# Patient Record
Sex: Female | Born: 1955
Health system: Southern US, Community
[De-identification: ages and names within clinical notes are randomized; demographics above are authoritative.]

## PROBLEM LIST (undated history)

## (undated) DIAGNOSIS — K635 Polyp of colon: Secondary | ICD-10-CM

## (undated) DIAGNOSIS — G43909 Migraine, unspecified, not intractable, without status migrainosus: Secondary | ICD-10-CM

## (undated) DIAGNOSIS — N951 Menopausal and female climacteric states: Secondary | ICD-10-CM

## (undated) DIAGNOSIS — K3184 Gastroparesis: Secondary | ICD-10-CM

## (undated) DIAGNOSIS — M1990 Unspecified osteoarthritis, unspecified site: Secondary | ICD-10-CM

## (undated) DIAGNOSIS — K219 Gastro-esophageal reflux disease without esophagitis: Secondary | ICD-10-CM

## (undated) DIAGNOSIS — M329 Systemic lupus erythematosus, unspecified: Secondary | ICD-10-CM

## (undated) DIAGNOSIS — I7300 Raynaud's syndrome without gangrene: Secondary | ICD-10-CM

## (undated) DIAGNOSIS — IMO0002 Reserved for concepts with insufficient information to code with codable children: Secondary | ICD-10-CM

## (undated) DIAGNOSIS — E785 Hyperlipidemia, unspecified: Secondary | ICD-10-CM

## (undated) DIAGNOSIS — F419 Anxiety disorder, unspecified: Secondary | ICD-10-CM

## (undated) DIAGNOSIS — I491 Atrial premature depolarization: Secondary | ICD-10-CM

## (undated) HISTORY — PX: NASAL SINUS SURGERY: SHX719

## (undated) HISTORY — PX: WRIST SURGERY: SHX841

## (undated) HISTORY — DX: Unspecified osteoarthritis, unspecified site: M19.90

## (undated) HISTORY — PX: LAPAROSCOPY: SHX197

## (undated) HISTORY — DX: Reserved for concepts with insufficient information to code with codable children: IMO0002

## (undated) HISTORY — PX: HEMORRHOID SURGERY: SHX153

## (undated) HISTORY — DX: Raynaud's syndrome without gangrene: I73.00

## (undated) HISTORY — PX: TOTAL KNEE ARTHROPLASTY: SHX125

## (undated) HISTORY — DX: Gastroparesis: K31.84

## (undated) HISTORY — DX: Anxiety disorder, unspecified: F41.9

## (undated) HISTORY — DX: Migraine, unspecified, not intractable, without status migrainosus: G43.909

## (undated) HISTORY — DX: Menopausal and female climacteric states: N95.1

## (undated) HISTORY — DX: Gastro-esophageal reflux disease without esophagitis: K21.9

## (undated) HISTORY — DX: Systemic lupus erythematosus, unspecified: M32.9

## (undated) HISTORY — DX: Polyp of colon: K63.5

## (undated) HISTORY — PX: CHOLECYSTECTOMY: SHX55

## (undated) HISTORY — DX: Atrial premature depolarization: I49.1

## (undated) HISTORY — PX: HIP PINNING: SHX1757

## (undated) HISTORY — DX: Hyperlipidemia, unspecified: E78.5

---

## 2006-06-11 ENCOUNTER — Inpatient Hospital Stay (HOSPITAL_COMMUNITY): Admission: RE | Admit: 2006-06-11 | Discharge: 2006-06-16 | Payer: Self-pay | Admitting: Orthopaedic Surgery

## 2010-09-10 ENCOUNTER — Encounter: Payer: Self-pay | Admitting: Orthopaedic Surgery

## 2012-03-27 LAB — HM MAMMOGRAPHY

## 2013-03-13 ENCOUNTER — Other Ambulatory Visit: Payer: Self-pay | Admitting: *Deleted

## 2013-03-13 DIAGNOSIS — R5383 Other fatigue: Secondary | ICD-10-CM

## 2013-03-13 DIAGNOSIS — E785 Hyperlipidemia, unspecified: Secondary | ICD-10-CM

## 2013-03-13 DIAGNOSIS — R5381 Other malaise: Secondary | ICD-10-CM

## 2013-03-13 DIAGNOSIS — E559 Vitamin D deficiency, unspecified: Secondary | ICD-10-CM

## 2013-03-16 ENCOUNTER — Other Ambulatory Visit: Payer: Self-pay

## 2013-03-18 ENCOUNTER — Other Ambulatory Visit: Payer: Self-pay

## 2013-03-20 ENCOUNTER — Other Ambulatory Visit: Payer: Self-pay

## 2013-03-20 LAB — COMPLETE METABOLIC PANEL WITH GFR
ALT: 9 U/L (ref 0–35)
AST: 17 U/L (ref 0–37)
Albumin: 4.2 g/dL (ref 3.5–5.2)
Alkaline Phosphatase: 67 U/L (ref 39–117)
BUN: 11 mg/dL (ref 6–23)
CO2: 34 mEq/L — ABNORMAL HIGH (ref 19–32)
Calcium: 9.5 mg/dL (ref 8.4–10.5)
Chloride: 101 mEq/L (ref 96–112)
Creat: 0.89 mg/dL (ref 0.50–1.10)
GFR, Est African American: 84 mL/min
GFR, Est Non African American: 73 mL/min
Glucose, Bld: 95 mg/dL (ref 70–99)
Potassium: 4.4 mEq/L (ref 3.5–5.3)
Sodium: 139 mEq/L (ref 135–145)
Total Bilirubin: 0.6 mg/dL (ref 0.3–1.2)
Total Protein: 7.1 g/dL (ref 6.0–8.3)

## 2013-03-20 LAB — CBC WITH DIFFERENTIAL/PLATELET
Basophils Absolute: 0 10*3/uL (ref 0.0–0.1)
Basophils Relative: 0 % (ref 0–1)
Eosinophils Absolute: 0.2 10*3/uL (ref 0.0–0.7)
Eosinophils Relative: 5 % (ref 0–5)
HCT: 36.1 % (ref 36.0–46.0)
Hemoglobin: 12.1 g/dL (ref 12.0–15.0)
Lymphocytes Relative: 30 % (ref 12–46)
Lymphs Abs: 1.4 10*3/uL (ref 0.7–4.0)
MCH: 28.9 pg (ref 26.0–34.0)
MCHC: 33.5 g/dL (ref 30.0–36.0)
MCV: 86.4 fL (ref 78.0–100.0)
Monocytes Absolute: 0.5 10*3/uL (ref 0.1–1.0)
Monocytes Relative: 11 % (ref 3–12)
Neutro Abs: 2.5 10*3/uL (ref 1.7–7.7)
Neutrophils Relative %: 54 % (ref 43–77)
Platelets: 205 10*3/uL (ref 150–400)
RBC: 4.18 MIL/uL (ref 3.87–5.11)
RDW: 15.1 % (ref 11.5–15.5)
WBC: 4.7 10*3/uL (ref 4.0–10.5)

## 2013-03-20 LAB — TSH: TSH: 1.232 u[IU]/mL (ref 0.350–4.500)

## 2013-03-20 LAB — LIPID PANEL
Cholesterol: 159 mg/dL (ref 0–200)
HDL: 65 mg/dL (ref >39)
LDL Cholesterol: 79 mg/dL (ref 0–99)
Total CHOL/HDL Ratio: 2.4 Ratio
Triglycerides: 75 mg/dL (ref <150)
VLDL: 15 mg/dL (ref 0–40)

## 2013-03-21 LAB — VITAMIN D 25 HYDROXY (VIT D DEFICIENCY, FRACTURES): Vit D, 25-Hydroxy: 38 ng/mL (ref 30–89)

## 2013-03-23 ENCOUNTER — Encounter: Payer: Self-pay | Admitting: Family Medicine

## 2013-03-23 ENCOUNTER — Ambulatory Visit (INDEPENDENT_AMBULATORY_CARE_PROVIDER_SITE_OTHER): Payer: Self-pay | Admitting: Family Medicine

## 2013-03-23 ENCOUNTER — Other Ambulatory Visit: Payer: Self-pay

## 2013-03-23 VITALS — BP 106/68 | HR 66 | Wt 122.0 lb

## 2013-03-23 DIAGNOSIS — H6123 Impacted cerumen, bilateral: Secondary | ICD-10-CM

## 2013-03-23 DIAGNOSIS — K5900 Constipation, unspecified: Secondary | ICD-10-CM | POA: Insufficient documentation

## 2013-03-23 DIAGNOSIS — R002 Palpitations: Secondary | ICD-10-CM | POA: Insufficient documentation

## 2013-03-23 DIAGNOSIS — K219 Gastro-esophageal reflux disease without esophagitis: Secondary | ICD-10-CM | POA: Insufficient documentation

## 2013-03-23 DIAGNOSIS — H6120 Impacted cerumen, unspecified ear: Secondary | ICD-10-CM | POA: Insufficient documentation

## 2013-03-23 DIAGNOSIS — G4700 Insomnia, unspecified: Secondary | ICD-10-CM | POA: Insufficient documentation

## 2013-03-23 DIAGNOSIS — E785 Hyperlipidemia, unspecified: Secondary | ICD-10-CM

## 2013-03-23 DIAGNOSIS — E782 Mixed hyperlipidemia: Secondary | ICD-10-CM | POA: Insufficient documentation

## 2013-03-23 MED ORDER — LINACLOTIDE 290 MCG PO CAPS: 1.0000 | ORAL_CAPSULE | Freq: Every day | ORAL | Status: DC

## 2013-03-23 MED ORDER — ATORVASTATIN CALCIUM 20 MG PO TABS: 20.0000 mg | ORAL_TABLET | Freq: Every day | ORAL | Status: DC

## 2013-03-23 MED ORDER — OMEPRAZOLE 20 MG PO CPDR: 20.0000 mg | DELAYED_RELEASE_CAPSULE | Freq: Two times a day (BID) | ORAL | Status: DC

## 2013-03-23 MED ORDER — ESZOPICLONE 2 MG PO TABS: 2.0000 mg | ORAL_TABLET | Freq: Every evening | ORAL | Status: DC | PRN

## 2013-03-23 MED ORDER — ATENOLOL 50 MG PO TABS: 50.0000 mg | ORAL_TABLET | Freq: Every day | ORAL | Status: DC

## 2013-03-23 NOTE — Patient Instructions (Addendum)
1)  Constipation/Abdominal Pain - Try the Linzess 290 mg and get filled if it works for you.  Check on the price you are paying for omeprazole and compare it with what you can get at [facility]. You can mail this prescription to them if you want to get it there and they will mail the drug to you.   2)  Throat/Ear Issues - Follow up with [facility] if you continue to have issues.    3)   Menopause - Continue doing your hormones like you have been doing.     Diet and Irritable Bowel Syndrome  No cure has been found for irritable bowel syndrome (IBS). Many options are available to treat the symptoms. Your caregiver will give you the best treatments available for your symptoms. He or she will also encourage you to manage stress and to make changes to your diet. You need to work with your caregiver and Registered Dietician to find the best combination of medicine, diet, counseling, and support to control your symptoms. The following are some diet suggestions. FOODS THAT MAKE IBS WORSE  Fatty foods, such as [place] fries.  Milk products, such as cheese or ice cream.  Chocolate.  Alcohol.  Caffeine (found in coffee and some sodas).  Carbonated drinks, such as soda. If certain foods cause symptoms, you should eat less of them or stop eating them. FOOD JOURNAL   Keep a journal of the foods that seem to cause distress. Write down:  What you are eating during the day and when.  What problems you are having after eating.  When the symptoms occur in relation to your meals.  What foods always make you feel badly.  Take your notes with you to your caregiver to see if you should stop eating certain foods. FOODS THAT MAKE IBS BETTER Fiber reduces IBS symptoms, especially constipation, because it makes stools soft, bulky, and easier to pass. Fiber is found in bran, bread, cereal, beans, fruit, and vegetables. Examples of foods with fiber  include:  Apples.  Peaches.  Pears.  Berries.  Figs.  Broccoli, raw.  Cabbage.  Carrots.  Raw peas.  Kidney beans.  Lima beans.  Whole-grain bread.  Whole-grain cereal. Add foods with fiber to your diet a little at a time. This will let your body get used to them. Too much fiber at once might cause gas and swelling of your abdomen. This can trigger symptoms in a person with IBS. Caregivers usually recommend a diet with enough fiber to produce soft, painless bowel movements. High fiber diets may cause gas and bloating. However, these symptoms often go away within a few weeks, as your body adjusts. In many cases, dietary fiber may lessen IBS symptoms, particularly constipation. However, it may not help pain or diarrhea. High fiber diets keep the colon mildly enlarged (distended) with the added fiber. This may help prevent spasms in the colon. Some forms of fiber also keep water in the stool, thereby preventing hard stools that are difficult to pass.  Besides telling you to eat more foods with fiber, your caregiver may also tell you to get more fiber by taking a fiber pill or drinking water mixed with a special high fiber powder. An example of this is a natural fiber laxative containing psyllium seed.  TIPS  Large meals can cause cramping and diarrhea in people with IBS. If this happens to you, try eating 4 or 5 small meals a day, or try eating less at each of  your usual 3 meals. It may also help if your meals are low in fat and high in carbohydrates. Examples of carbohydrates are pasta, rice, whole-grain breads and cereals, fruits, and vegetables.  If dairy products cause your symptoms to flare up, you can try eating less of those foods. You might be able to handle yogurt better than other dairy products, because it contains bacteria that helps with digestion. Dairy products are an important source of calcium and other nutrients. If you need to avoid dairy products, be sure to talk  with a Registered Dietitian about getting these nutrients through other food sources.  Drink enough water and fluids to keep your urine clear or pale yellow. This is important, especially if you have diarrhea. FOR MORE INFORMATION  International Foundation for Functional Gastrointestinal Disorders: www.iffgd.org  National Digestive Diseases Information Clearinghouse: digestive.StageSync.si Document Released: 10/27/2003 Document Revised: 10/29/2011 Document Reviewed: 07/14/2007 [facility] Patient Information 2014 [place], [place].

## 2013-03-23 NOTE — Assessment & Plan Note (Signed)
The 145 mg of Linzess has helped her constipation but she continues to have pain so...she will try the 290.  She was given a prescription for this dosage if it works for her.

## 2013-03-23 NOTE — Assessment & Plan Note (Signed)
I recommended that she decrease her intake of caffeine and take the 50 mg of atenolol if needed.

## 2013-03-23 NOTE — Assessment & Plan Note (Signed)
Ear canals were noted to be blocked with cerumen.  They were flushed with warm water and an ear scoop was used to remove the cerumen.

## 2013-03-23 NOTE — Assessment & Plan Note (Signed)
She was given a prescription to send to [facility].

## 2013-03-23 NOTE — Progress Notes (Signed)
Subjective:    Patient ID: Taylor [name], female    DOB: 1956/06/25, 57 y.o.   MRN: [id number]  HPI  Taylor [name] is here today to go over her most recent lab results and to discuss the conditions listed below:   1)  Hyperlipidemia:  She is taking 10 mg (1/2 of the 20 mg tabs) for her cholesterol and is doing fine on this medication.    2)  Constipation:  She was given some samples of Linzess which has worked well for her.  She would like to continue on it and needs a prescription.    3)  Palpitations:  She has taken atenolol for this for years.  She takes 25-50 mg which has controlled her symptoms.  She was told that the 50 mg might cause sexual dysfunction so she has been trying to limit her dosage to the 25 mg. She has had some increased symptoms lately.    4)  Insomnia:  She has struggled with this problem for years.  She has tried [place] in the past and it has helped her. She would like to have a prescription.    5)  Ear Problems:  She has several complaints about her ears L > R. She has some discomfort and she has "ringing" at times.  They feel "full" and her left ear actually feels numb.  6)  Menopause:  She is using a combination of the Minivelle patch and Prometrium. She was having some breakthrough bleeding when she was changing the patch twice a week and taking the Prometrium daily.  She started only changing the patch once a week and only takes the Prometrium every other day and her bleeding has stopped.     Review of Systems  Constitutional: Positive for appetite change (She attributes her lack of appetite to her chronic pain.  ), fatigue and unexpected weight change.  HENT: Positive for ear pain and congestion. Negative for hearing loss and ear discharge.   Respiratory: Negative.   Cardiovascular: Positive for palpitations.  Gastrointestinal: Positive for constipation (Improved with Linzess.  ).  Genitourinary: Negative.   Musculoskeletal: Positive for myalgias, back pain and  arthralgias.  Skin: Negative.   Neurological: Positive for dizziness, light-headedness and numbness.  Psychiatric/Behavioral: Positive for sleep disturbance.    Past Medical History  Diagnosis Date  . Symptomatic menopausal or female climacteric states   . Premature atrial contractions   . Hyperlipidemia   . GERD (gastroesophageal reflux disease)   . Migraine headache   . Lupus   . Osteoarthritis   . Raynaud's phenomenon   . Colon polyp   . Gastroparesis   . Symptomatic menopausal or female climacteric states     Family History  Problem Relation Age of Onset  . CVA Mother   . Alzheimer's disease Mother   . Heart disease Father   . Diabetes Father   . Hypertension Father   . Hyperlipidemia Father   . CVA Brother   . Cancer Maternal Aunt     Ovarian Cancer  . Cancer Maternal Uncle     Colon Cancer    History   Social History Narrative   Marital Status: Married Therapist, art)    Children:  Daughters (2) Adopted and a Insurance claims handler Child (Female)    Pets:  Dog (1)     Living Situation: Lives with  husband, daughters and another boy.     Occupation: Futures trader; She has worked as a Adult nurse in the past.  Education:  Engineer, maintenance (IT)    Tobacco Use/Exposure:  None    Alcohol Use:  None    Drug Use:  None   Diet:  Regular   Exercise:  Swimming 2 x week/ Walking    Hobbies:                Objective:   Physical Exam  Constitutional: She appears well-nourished. No distress.  HENT:  Head: Normocephalic.  Mouth/Throat: No oropharyngeal exudate.  Cerumen in ear canals   Eyes: Conjunctivae are normal. Right eye exhibits no discharge. Left eye exhibits no discharge.  Neck: Neck supple.  Cardiovascular: Normal rate, regular rhythm and normal heart sounds.  Exam reveals no gallop and no friction rub.   No murmur heard. Pulmonary/Chest: Effort normal and breath sounds normal. She has no wheezes. She exhibits no tenderness.  Lymphadenopathy:    She has no cervical  adenopathy.  Neurological: She is alert.  Skin: Skin is warm and dry. No rash noted.  Psychiatric: She has a normal mood and affect.      Assessment & Plan:

## 2013-03-23 NOTE — Assessment & Plan Note (Signed)
Her lipid panel is perfect on Lipitor 10 mg so she will remain on this medication.

## 2013-03-23 NOTE — Assessment & Plan Note (Signed)
She was given a prescription for Lunesta.

## 2013-03-26 ENCOUNTER — Ambulatory Visit: Payer: Self-pay | Admitting: Family Medicine

## 2013-09-15 ENCOUNTER — Ambulatory Visit (INDEPENDENT_AMBULATORY_CARE_PROVIDER_SITE_OTHER): Payer: Commercial Managed Care - PPO | Admitting: Family Medicine

## 2013-09-15 ENCOUNTER — Encounter: Payer: Self-pay | Admitting: Family Medicine

## 2013-09-15 ENCOUNTER — Encounter (INDEPENDENT_AMBULATORY_CARE_PROVIDER_SITE_OTHER): Payer: Self-pay

## 2013-09-15 VITALS — BP 107/76 | HR 82 | Resp 16 | Ht 61.0 in | Wt 110.0 lb

## 2013-09-15 DIAGNOSIS — R002 Palpitations: Secondary | ICD-10-CM

## 2013-09-15 DIAGNOSIS — K5900 Constipation, unspecified: Secondary | ICD-10-CM

## 2013-09-15 DIAGNOSIS — E785 Hyperlipidemia, unspecified: Secondary | ICD-10-CM

## 2013-09-15 DIAGNOSIS — K589 Irritable bowel syndrome without diarrhea: Secondary | ICD-10-CM

## 2013-09-15 DIAGNOSIS — R634 Abnormal weight loss: Secondary | ICD-10-CM

## 2013-09-15 DIAGNOSIS — J069 Acute upper respiratory infection, unspecified: Secondary | ICD-10-CM

## 2013-09-15 DIAGNOSIS — N959 Unspecified menopausal and perimenopausal disorder: Secondary | ICD-10-CM

## 2013-09-15 MED ORDER — ESTRADIOL 0.1 MG/24HR TD PTTW: 1.0000 | MEDICATED_PATCH | TRANSDERMAL | Status: DC

## 2013-09-15 MED ORDER — LINACLOTIDE 290 MCG PO CAPS: 290.0000 ug | ORAL_CAPSULE | Freq: Every day | ORAL | Status: DC

## 2013-09-15 MED ORDER — PROGESTERONE MICRONIZED 100 MG PO CAPS: 100.0000 mg | ORAL_CAPSULE | Freq: Every day | ORAL | Status: DC

## 2013-09-15 MED ORDER — DICYCLOMINE HCL 20 MG PO TABS: 20.0000 mg | ORAL_TABLET | Freq: Three times a day (TID) | ORAL | Status: AC

## 2013-09-15 NOTE — Progress Notes (Signed)
Subjective:    Patient ID: Taylor [name], female    DOB: Jun 26, 1956, 58 y.o.   MRN: [id number]  HPI  Taylor [name] is here today to follow up on the following issues:  1)  Hyperlipidemia:  She is taking the Lipitor and needs a refill.   2)  Palpitations:  She is doing well on the Atenolol.   3)  Post Menopausal:  She would like to discuss cheaper options for her symptoms.   4)  IBS:  She is doing well on the combination of Linzess and Bentyl. She needs refills on these.  5)  Cyst: She has a cyst on her head that needs to be checked.  6)  Ear Pain:  She would like to just have her ears checked since she had them flushed at the last visit.  7)  Cold: She has been dealing with cold symptoms for about 5 days. She has been taking OTC medicines and they seem to be helping. Her daughter had these same symptoms last week and is feeling better now for the most part.   8)  Weight loss:  She has lost 12 pounds since her last visit in August. She is getting concerned about this. She admits to not eating very much.     Review of Systems  Constitutional: Positive for chills, fatigue and unexpected weight change. Negative for fever.       She has lost 12 pounds since August.   HENT: Positive for ear pain, sinus pressure and sore throat.   Respiratory: Positive for cough.   Musculoskeletal: Positive for myalgias.  All other systems reviewed and are negative.    Past Medical History  Diagnosis Date  . Symptomatic menopausal or female climacteric states   . Premature atrial contractions   . Hyperlipidemia   . GERD (gastroesophageal reflux disease)   . Migraine headache   . Lupus   . Osteoarthritis   . Raynaud's phenomenon   . Colon polyp   . Gastroparesis   . Symptomatic menopausal or female climacteric states   . Anxiety      Past Surgical History  Procedure Laterality Date  . Total knee arthroplasty      left knee  . Hip pinning    . Cholecystectomy    . Laparoscopy    . Nasal  sinus surgery    . Wrist surgery      Left wrist  . Hemorrhoid surgery       History   Social History Narrative   Marital Status: Married Therapist, art)    Children:  Daughters (2) Adopted and a Insurance claims handler Child (Female)    Pets:  Dog (1)     Living Situation: Lives with  husband, daughters and another boy.     Occupation: Futures trader; She has worked as a Adult nurse in the past.      Education:  Engineer, maintenance (IT)    Tobacco Use/Exposure:  None    Alcohol Use:  None    Drug Use:  None   Diet:  Regular   Exercise:  Swimming 2 x week/ Walking    Hobbies:               Family History  Problem Relation Age of Onset  . CVA Mother   . Alzheimer's disease Mother   . Heart disease Father   . Diabetes Father   . Hypertension Father   . Hyperlipidemia Father   . CVA Brother   . Cancer Maternal Aunt  Ovarian Cancer  . Cancer Maternal Uncle     Colon Cancer     Current Outpatient Prescriptions on File Prior to Visit  Medication Sig Dispense Refill  . aspirin 81 MG tablet Take 81 mg by mouth daily.      Marland Kitchen atenolol (TENORMIN) 50 MG tablet Take 1 tablet (50 mg total) by mouth daily.  30 tablet  11  . atorvastatin (LIPITOR) 20 MG tablet Take 1 tablet (20 mg total) by mouth daily.  30 tablet  5  . clonazePAM (KLONOPIN) 1 MG tablet Take 1 mg by mouth 2 (two) times daily as needed for anxiety.      . eszopiclone (LUNESTA) 2 MG TABS tablet Take 1 tablet (2 mg total) by mouth at bedtime as needed. Take immediately before bedtime  30 tablet  2  . hydroxychloroquine (PLAQUENIL) 200 MG tablet Take 200 mg by mouth daily.      Marland Kitchen omeprazole (PRILOSEC) 20 MG capsule Take 1 capsule (20 mg total) by mouth 2 (two) times daily.  180 capsule  3  . oxycodone (OXY-IR) 5 MG capsule Take 5 mg by mouth every 4 (four) hours as needed.      Marland Kitchen oxyCODONE (OXYCONTIN) 10 MG 12 hr tablet Take 10 mg by mouth every 12 (twelve) hours as needed for pain.      . SUMAtriptan (IMITREX) 20 MG/ACT nasal spray Place 1  spray into the nose every 2 (two) hours as needed for migraine.       No current facility-administered medications on file prior to visit.     Allergies  Allergen Reactions  . Lyrica [Pregabalin]   . Phenergan [Promethazine]   . Prochlorperazine      Immunization History  Administered Date(s) Administered  . Pneumococcal Conjugate-13 11/28/2010  . Tdap 11/28/2010  . Zoster 02/27/2012        Objective:   Physical Exam  Nursing note and vitals reviewed. Constitutional: She appears well-nourished. No distress.  HENT:  Head: Normocephalic.  Mouth/Throat: No oropharyngeal exudate.  Cerumen in ear canals   Eyes: Conjunctivae are normal. Right eye exhibits no discharge. Left eye exhibits no discharge.  Neck: Neck supple.  Cardiovascular: Normal rate, regular rhythm and normal heart sounds.  Exam reveals no gallop and no friction rub.   No murmur heard. Pulmonary/Chest: Effort normal and breath sounds normal. She has no wheezes. She exhibits no tenderness.  Lymphadenopathy:    She has no cervical adenopathy.  Neurological: She is alert.  Skin: Skin is warm and dry. No rash noted.  Psychiatric: She has a normal mood and affect.      Assessment & Plan:    Taylor [name] was seen today for medication management.  Diagnoses and associated orders for this visit:  Palpitations Comments: Symptoms are controlled.    IBS (irritable bowel syndrome) - dicyclomine (BENTYL) 20 MG tablet; Take 1 tablet (20 mg total) by mouth 3 (three) times daily before meals.  Other and unspecified hyperlipidemia - atorvastatin (LIPITOR) 20 MG tablet; Take 1 tablet (20 mg total) by mouth daily.  Loss of weight  Acute upper respiratory infections of unspecified site  Post menopausal problems - estradiol (MINIVELLE) 0.1 MG/24HR patch; Place 1 patch (0.1 mg total) onto the skin 2 (two) times a week. - estradiol (VIVELLE-DOT) 0.1 MG/24HR patch; Place 1 patch (0.1 mg total) onto the skin 2 (two) times a  week. - progesterone (PROMETRIUM) 100 MG capsule; Take 1 capsule (100 mg total) by mouth daily.  Unspecified constipation -  Linaclotide (LINZESS) 290 MCG CAPS capsule; Take 1 capsule (290 mcg total) by mouth daily.   TIME SPENT "FACE TO FACE" WITH PATIENT -  30 MINS

## 2013-11-15 DIAGNOSIS — R634 Abnormal weight loss: Secondary | ICD-10-CM | POA: Insufficient documentation

## 2013-11-15 DIAGNOSIS — J069 Acute upper respiratory infection, unspecified: Secondary | ICD-10-CM | POA: Insufficient documentation

## 2013-11-15 DIAGNOSIS — K581 Irritable bowel syndrome with constipation: Secondary | ICD-10-CM | POA: Insufficient documentation

## 2013-11-15 DIAGNOSIS — N959 Unspecified menopausal and perimenopausal disorder: Secondary | ICD-10-CM | POA: Insufficient documentation

## 2013-11-15 MED ORDER — ATORVASTATIN CALCIUM 20 MG PO TABS
20.0000 mg | ORAL_TABLET | Freq: Every day | ORAL | Status: DC
Start: 2013-11-15 — End: 2014-03-19

## 2014-03-12 ENCOUNTER — Other Ambulatory Visit: Payer: Self-pay | Admitting: *Deleted

## 2014-03-12 DIAGNOSIS — Z0000 Encounter for general adult medical examination without abnormal findings: Secondary | ICD-10-CM

## 2014-03-15 ENCOUNTER — Other Ambulatory Visit: Payer: Commercial Managed Care - PPO

## 2014-03-16 LAB — CBC WITH DIFFERENTIAL/PLATELET
Basophils Absolute: 0 10*3/uL (ref 0.0–0.1)
Basophils Relative: 0 % (ref 0–1)
Eosinophils Absolute: 0.3 10*3/uL (ref 0.0–0.7)
Eosinophils Relative: 7 % — ABNORMAL HIGH (ref 0–5)
HCT: 36.3 % (ref 36.0–46.0)
Hemoglobin: 12.1 g/dL (ref 12.0–15.0)
Lymphocytes Relative: 40 % (ref 12–46)
Lymphs Abs: 1.7 10*3/uL (ref 0.7–4.0)
MCH: 28.3 pg (ref 26.0–34.0)
MCHC: 33.3 g/dL (ref 30.0–36.0)
MCV: 85 fL (ref 78.0–100.0)
Monocytes Absolute: 0.5 10*3/uL (ref 0.1–1.0)
Monocytes Relative: 11 % (ref 3–12)
Neutro Abs: 1.8 10*3/uL (ref 1.7–7.7)
Neutrophils Relative %: 42 % — ABNORMAL LOW (ref 43–77)
Platelets: 189 10*3/uL (ref 150–400)
RBC: 4.27 MIL/uL (ref 3.87–5.11)
RDW: 15.5 % (ref 11.5–15.5)
WBC: 4.2 10*3/uL (ref 4.0–10.5)

## 2014-03-16 LAB — LIPID PANEL
Cholesterol: 158 mg/dL (ref 0–200)
HDL: 61 mg/dL (ref >39)
LDL Cholesterol: 81 mg/dL (ref 0–99)
Total CHOL/HDL Ratio: 2.6 Ratio
Triglycerides: 80 mg/dL (ref <150)
VLDL: 16 mg/dL (ref 0–40)

## 2014-03-16 LAB — COMPLETE METABOLIC PANEL WITH GFR
ALT: 10 U/L (ref 0–35)
AST: 15 U/L (ref 0–37)
Albumin: 3.9 g/dL (ref 3.5–5.2)
Alkaline Phosphatase: 60 U/L (ref 39–117)
BUN: 11 mg/dL (ref 6–23)
CO2: 32 mEq/L (ref 19–32)
Calcium: 9.4 mg/dL (ref 8.4–10.5)
Chloride: 104 mEq/L (ref 96–112)
Creat: 0.72 mg/dL (ref 0.50–1.10)
GFR, Est African American: >89 mL/min
GFR, Est Non African American: >89 mL/min
Glucose, Bld: 90 mg/dL (ref 70–99)
Potassium: 4.2 mEq/L (ref 3.5–5.3)
Sodium: 143 mEq/L (ref 135–145)
Total Bilirubin: 0.5 mg/dL (ref 0.2–1.2)
Total Protein: 6.6 g/dL (ref 6.0–8.3)

## 2014-03-16 LAB — TSH: TSH: 0.957 u[IU]/mL (ref 0.350–4.500)

## 2014-03-19 ENCOUNTER — Ambulatory Visit (INDEPENDENT_AMBULATORY_CARE_PROVIDER_SITE_OTHER): Payer: Commercial Managed Care - PPO | Admitting: Family Medicine

## 2014-03-19 ENCOUNTER — Encounter: Payer: Self-pay | Admitting: Family Medicine

## 2014-03-19 VITALS — BP 99/63 | HR 72 | Resp 16 | Ht 61.0 in | Wt 109.0 lb

## 2014-03-19 DIAGNOSIS — I10 Essential (primary) hypertension: Secondary | ICD-10-CM

## 2014-03-19 DIAGNOSIS — E785 Hyperlipidemia, unspecified: Secondary | ICD-10-CM

## 2014-03-19 DIAGNOSIS — N959 Unspecified menopausal and perimenopausal disorder: Secondary | ICD-10-CM

## 2014-03-19 DIAGNOSIS — K5900 Constipation, unspecified: Secondary | ICD-10-CM

## 2014-03-19 MED ORDER — ESTRADIOL 0.1 MG/24HR TD PTTW: 1.0000 | MEDICATED_PATCH | TRANSDERMAL | Status: DC

## 2014-03-19 MED ORDER — LINACLOTIDE 290 MCG PO CAPS: 290.0000 ug | ORAL_CAPSULE | Freq: Every day | ORAL | Status: DC

## 2014-03-19 MED ORDER — PROGESTERONE MICRONIZED 100 MG PO CAPS
100.0000 mg | ORAL_CAPSULE | Freq: Every day | ORAL | Status: DC
Start: 2014-03-19 — End: 2018-06-26

## 2014-03-19 MED ORDER — METOPROLOL SUCCINATE ER 25 MG PO TB24: 25.0000 mg | ORAL_TABLET | Freq: Every day | ORAL | Status: DC

## 2014-03-19 MED ORDER — MAGNESIUM OXIDE -MG SUPPLEMENT 500 MG PO TABS: 1.0000 | ORAL_TABLET | Freq: Every day | ORAL | Status: AC

## 2014-03-19 MED ORDER — ATORVASTATIN CALCIUM 10 MG PO TABS: ORAL_TABLET | ORAL | Status: DC

## 2014-03-19 NOTE — Progress Notes (Signed)
Subjective:    Patient ID: Taylor [name], female    DOB: 08/09/1956, 58 y.o.   MRN: [id number]  HPI  Taylor [name] is here today to follow up on her recent lab results. She is recovering from recent back surgery. She is needing to get several medication refills.  1)  Hypertension -  Her BP is controlled on atenolol.   2)  HRT - She is doing well on the combination of the Min[place]elle patch and Prometrium    3)  Hyperlipidemia:  She is doing well on the Lipitor (10 mg daily)    4)  IBS:  She is needing a refill on the Linzess.    Review of Systems  Constitutional: Negative for activity change, appetite change and fatigue.  Cardiovascular: Negative for chest pain, palpitations and leg swelling.  Musculoskeletal: Positive for myalgias. Negative for back pain.  Psychiatric/Behavioral: Negative for behavioral problems. The patient is not nervous/anxious.   All other systems reviewed and are negative.    Past Medical History  Diagnosis Date  . Symptomatic menopausal or female climacteric states   . Premature atrial contractions   . Hyperlipidemia   . GERD (gastroesophageal reflux disease)   . Migraine headache   . Lupus   . Osteoarthritis   . Raynaud's phenomenon   . Colon polyp   . Gastroparesis   . Symptomatic menopausal or female climacteric states   . Anxiety   . Previous back surgery 11/2013     Past Surgical History  Procedure Laterality Date  . Total knee arthroplasty      left knee  . Hip pinning    . Cholecystectomy    . Laparoscopy    . Nasal sinus surgery    . Wrist surgery      Left wrist  . Hemorrhoid surgery       History   Social History Narrative   Marital Status: Married Therapist, art)    Children:  Daughters (2) Adopted and a Insurance claims handler Child (Female)    Pets:  Dog (1)     Living Situation: Lives with  husband, daughters and another boy.     Occupation: Futures trader; She has worked as a Adult nurse in the past.      Education:  Engineer, maintenance (IT)    Tobacco  Use/Exposure:  None    Alcohol Use:  None    Drug Use:  None   Diet:  Regular   Exercise:  Swimming 2 x week/ Walking    Hobbies:               Family History  Problem Relation Age of Onset  . CVA Mother   . Alzheimer's disease Mother   . Heart disease Father   . Diabetes Father   . Hypertension Father   . Hyperlipidemia Father   . CVA Brother   . Cancer Maternal Aunt     Ovarian Cancer  . Cancer Maternal Uncle     Colon Cancer     Current Outpatient Prescriptions on File Prior to Visit  Medication Sig Dispense Refill  . aspirin 81 MG tablet Take 81 mg by mouth daily.      Marland Kitchen atenolol (TENORMIN) 50 MG tablet Take 1 tablet (50 mg total) by mouth daily.  30 tablet  11  . clonazePAM (KLONOPIN) 1 MG tablet Take 1 mg by mouth 2 (two) times daily as needed for anxiety.      . dicyclomine (BENTYL) 20 MG tablet Take 1 tablet (20 mg  total) by mouth 3 (three) times daily before meals.  30 tablet  2  . omeprazole (PRILOSEC) 20 MG capsule Take 1 capsule (20 mg total) by mouth 2 (two) times daily.  180 capsule  3  . oxycodone (OXY-IR) 5 MG capsule Take 5 mg by mouth every 4 (four) hours as needed.      Marland Kitchen. oxyCODONE (OXYCONTIN) 10 MG 12 hr tablet Take 10 mg by mouth every 12 (twelve) hours as needed for pain.      . SUMAtriptan (IMITREX) 20 MG/ACT nasal spray Place 1 spray into the nose every 2 (two) hours as needed for migraine.       No current facility-administered medications on file prior to visit.     Allergies  Allergen Reactions  . Lyrica [Pregabalin]   . Phenergan [Promethazine]   . Prochlorperazine      Immunization History  Administered Date(s) Administered  . Pneumococcal Conjugate-13 11/28/2010  . Tdap 11/28/2010  . Zoster 02/27/2012       Objective:   Physical Exam  Vitals reviewed. Constitutional: She appears well-nourished.  Neck: Normal range of motion.  Pulmonary/Chest: Effort normal.  Genitourinary: Vagina normal.  Musculoskeletal: Normal range of  motion. She exhibits tenderness.  Neurological: She is alert.  Skin: Skin is dry.  Psychiatric: She has a normal mood and affect. Her behavior is normal. Judgment and thought content normal.      Assessment & Plan:   Taylor [name] was seen today for medication management and medical management of chronic issues.  Diagnoses and associated orders for this visit:  Essential hypertension, benign - metoprolol succinate (TOPROL XL) 25 MG 24 hr tablet; Take 1 tablet (25 mg total) by mouth daily.  Other and unspecified hyperlipidemia - atorvastatin (LIPITOR) 10 MG tablet; Take 1/2 tab daily  Post menopausal problems - progesterone (PROMETRIUM) 100 MG capsule; Take 1 capsule (100 mg total) by mouth daily. - estradiol (MINIVELLE) 0.1 MG/24HR patch; Place 1 patch (0.1 mg total) onto the skin 2 (two) times a week.  Unspecified constipation - Magnesium Oxide 500 MG TABS; Take 1 tablet (500 mg total) by mouth daily. - Linaclotide (LINZESS) 290 MCG CAPS capsule; Take 1 capsule (290 mcg total) by mouth daily.   TIME SPENT "FACE TO FACE" WITH PATIENT -  30 MINS

## 2014-04-18 DIAGNOSIS — I10 Essential (primary) hypertension: Secondary | ICD-10-CM | POA: Insufficient documentation

## 2018-02-12 ENCOUNTER — Ambulatory Visit (INDEPENDENT_AMBULATORY_CARE_PROVIDER_SITE_OTHER): Payer: PRIVATE HEALTH INSURANCE | Admitting: Family Medicine

## 2018-02-12 ENCOUNTER — Encounter: Payer: Self-pay | Admitting: Family Medicine

## 2018-02-12 VITALS — BP 98/68 | HR 70 | Temp 98.2°F | Ht 62.0 in | Wt 130.4 lb

## 2018-02-12 DIAGNOSIS — K3184 Gastroparesis: Secondary | ICD-10-CM

## 2018-02-12 DIAGNOSIS — R630 Anorexia: Secondary | ICD-10-CM

## 2018-02-12 DIAGNOSIS — K581 Irritable bowel syndrome with constipation: Secondary | ICD-10-CM

## 2018-02-12 LAB — COMPREHENSIVE METABOLIC PANEL
ALT: 13 U/L (ref 0–35)
AST: 21 U/L (ref 0–37)
Albumin: 4.3 g/dL (ref 3.5–5.2)
Alkaline Phosphatase: 66 U/L (ref 39–117)
BUN: 10 mg/dL (ref 6–23)
CO2: 36 mEq/L — ABNORMAL HIGH (ref 19–32)
CREATININE: 0.78 mg/dL (ref 0.40–1.20)
Calcium: 9.4 mg/dL (ref 8.4–10.5)
Chloride: 94 mEq/L — ABNORMAL LOW (ref 96–112)
GFR: 79.87 mL/min (ref >60.00)
Glucose, Bld: 131 mg/dL — ABNORMAL HIGH (ref 70–99)
Potassium: 4.2 mEq/L (ref 3.5–5.1)
Sodium: 135 mEq/L (ref 135–145)
TOTAL PROTEIN: 7.2 g/dL (ref 6.0–8.3)
Total Bilirubin: 0.6 mg/dL (ref 0.2–1.2)

## 2018-02-12 LAB — CBC
HEMATOCRIT: 38.9 % (ref 36.0–46.0)
HEMOGLOBIN: 13.1 g/dL (ref 12.0–15.0)
MCHC: 33.8 g/dL (ref 30.0–36.0)
MCV: 91.4 fl (ref 78.0–100.0)
Platelets: 233 10*3/uL (ref 150.0–400.0)
RBC: 4.26 Mil/uL (ref 3.87–5.11)
RDW: 13.5 % (ref 11.5–15.5)
WBC: 8.4 10*3/uL (ref 4.0–10.5)

## 2018-02-12 LAB — TSH: TSH: 0.86 u[IU]/mL (ref 0.35–4.50)

## 2018-02-12 NOTE — Progress Notes (Signed)
Chief Complaint  Patient presents with  . New Patient (Initial Visit)       New Patient Visit SUBJECTIVE: HPI: Taylor [name] is an 62 y.o.female who is being seen for establishing care.  The patient was previously seen at [facility].  She states she did not get along well with her previous provider.  3-4 days has been having a flare of her gastroparesis. Follows with gastroparesis specialist as she has been diagnosed for several decades.  She has a myriad of health issues other than this, and this seemed to prevent her from fully completing recommendations made by her specialist.  She denies any fevers, is having some nausea, particularly after eating.  She has around 2-3 bowel movements per month.  She is allergic to many medications such as Reglan and antinausea medicines.  Prozac has helped the most with her pain/anxiety.  She also has a history of migraines, lower back pain, and anxiety.  Allergies  Allergen Reactions  . Lyrica [Pregabalin]   . Phenergan [Promethazine]   . Prochlorperazine     Past Medical History:  Diagnosis Date  . Anxiety   . Colon polyp   . Gastroparesis   . GERD (gastroesophageal reflux disease)   . Hyperlipidemia   . Lupus ([place])   . Migraine headache   . Osteoarthritis   . Premature atrial contractions   . Previous back surgery 11/2013  . Raynaud's phenomenon   . Symptomatic menopausal or female climacteric states   . Symptomatic menopausal or female climacteric states    Past Surgical History:  Procedure Laterality Date  . CHOLECYSTECTOMY    . HEMORRHOID SURGERY    . HIP PINNING    . LAPAROSCOPY    . NASAL SINUS SURGERY    . TOTAL KNEE ARTHROPLASTY     left knee  . WRIST SURGERY     Left wrist   Family History  Problem Relation Age of Onset  . CVA Mother   . Alzheimer's disease Mother   . Heart disease Father   . Diabetes Father   . Hypertension Father   . Hyperlipidemia Father   . CVA Brother   . Cancer Maternal Aunt        Ovarian Cancer   . Cancer Maternal Uncle        Colon Cancer   Allergies  Allergen Reactions  . Lyrica [Pregabalin]   . Phenergan [Promethazine]   . Prochlorperazine     Current Outpatient Medications:  .  aspirin 81 MG tablet, Take 81 mg by mouth daily., Disp: , Rfl:  .  atorvastatin (LIPITOR) 10 MG tablet, Take 1/2 tab daily, Disp: 90 tablet, Rfl: 1 .  Buprenorphine HCl (BELBUCA) 450 MCG FILM, Place 1 patch inside cheek 2 (two) times daily., Disp: , Rfl:  .  celecoxib (CELEBREX) 200 MG capsule, Take 200 mg by mouth daily., Disp: , Rfl:  .  docusate sodium (COLACE) 100 MG capsule, Take 100 mg by mouth 2 (two) times daily., Disp: , Rfl:  .  estradiol (MINIVELLE) 0.1 MG/24HR patch, Place 1 patch (0.1 mg total) onto the skin 2 (two) times a week., Disp: 8 patch, Rfl: 11 .  FLUoxetine (PROZAC) 40 MG capsule, Take 40 mg by mouth daily., Disp: , Rfl:  .  Galcanezumab-gnlm (EMGALITY) 120 MG/ML SOAJ, Inject into the skin., Disp: , Rfl:  .  loratadine (CLARITIN) 10 MG tablet, Take 10 mg by mouth daily., Disp: , Rfl:  .  metoprolol succinate (TOPROL XL) 25 MG 24  hr tablet, Take 1 tablet (25 mg total) by mouth daily., Disp: 90 tablet, Rfl: 3 .  omeprazole (PRILOSEC) 20 MG capsule, Take 1 capsule (20 mg total) by mouth 2 (two) times daily., Disp: 180 capsule, Rfl: 3 .  oxycodone (OXY-IR) 5 MG capsule, Take 5 mg by mouth every 4 (four) hours as needed., Disp: , Rfl:  .  progesterone (PROMETRIUM) 100 MG capsule, Take 1 capsule (100 mg total) by mouth daily., Disp: 90 capsule, Rfl: 3 .  ranitidine (ZANTAC) 300 MG tablet, Take 300 mg by mouth at bedtime., Disp: , Rfl:  .  SUMAtriptan (IMITREX) 20 MG/ACT nasal spray, Place 1 spray into the nose every 2 (two) hours as needed for migraine., Disp: , Rfl:  .  amoxicillin (AMOXIL) 500 MG capsule, Take 500 mg by mouth 3 (three) times daily., Disp: , Rfl:  .  clonazePAM (KLONOPIN) 1 MG tablet, Take 1 mg by mouth 2 (two) times daily as needed for anxiety., Disp: , Rfl:   No  LMP recorded. Patient is postmenopausal.  ROS Const: Denies fevers  GI: As noted in HPI   OBJECTIVE: BP 98/68 (BP Location: Left Arm, Patient Position: Sitting, Cuff Size: Normal)   Pulse 70   Temp 98.2 F (36.8 C) (Oral)   Ht _0  (1.575 m)   Wt 130 lb 6 oz (59.1 kg)   SpO2 97%   BMI 23.85 kg/m   Constitutional: -  VS reviewed -  Well developed, well nourished, appears stated age -  No apparent distress  Psychiatric: -  Oriented to person, place, and time -  Memory intact -  Affect and mood normal -  Fluent conversation, good eye contact -  Judgment and insight age appropriate  Eye: -  Conjunctivae clear, no discharge -  Pupils symmetric, round, reactive to light  ENMT: -  MMM    Pharynx moist, no exudate, no erythema  Neck: -  No gross swelling, no palpable masses -  Thyroid midline, not enlarged, mobile, no palpable masses  Cardiovascular: -  RRR -  No LE edema  Respiratory: -  Normal respiratory effort, no accessory muscle use, no retraction -  Breath sounds equal, no wheezes, no ronchi, no crackles  Gastrointestinal: -  Bowel sounds normal -  Mild discomfort with palpation, no distention, no guarding, no masses  Musculoskeletal: -  No clubbing, no cyanosis -  Gait antalgic  Skin: -  No significant lesion on inspection -  Warm and dry to palpation   ASSESSMENT/PLAN: Irritable bowel syndrome with constipation  Gastroparesis  Poor appetite - Plan: Comprehensive metabolic panel, TSH, CBC  Nortriptyline could be helpful, but worry about TCA AE's as she is not very steady on her feet.  We will check some basic labs.  I did state that with her allergy list and the fact that she sees a specialist, long-term management should be managed by them.  I did recommend trialing the low FODMAP diet. Patient should return in 3 mo for med check. The patient voiced understanding and agreement to the plan.  Greater than 45 minutes were spent face to face with the patient with  greater than 50% of this time spent counseling on above dx, options from primary care standpoint, headaches, medication management, prognosis, follow up.     [place], DO 02/12/18  12:24 PM

## 2018-02-12 NOTE — Patient Instructions (Addendum)
Let's not make any medication changes today.  We can check some labs to make sure things are looking OK with your recent changes.  Let us know if you need anything.

## 2018-02-12 NOTE — Progress Notes (Signed)
Pre visit review using our clinic review tool, if applicable. No additional management support is needed unless otherwise documented below in the visit note. 

## 2018-05-14 ENCOUNTER — Encounter: Payer: Self-pay | Admitting: Family Medicine

## 2018-05-14 ENCOUNTER — Ambulatory Visit: Payer: BLUE CROSS/BLUE SHIELD | Admitting: Family Medicine

## 2018-05-14 VITALS — BP 114/84 | HR 79 | Temp 98.3°F | Resp 16 | Wt 119.0 lb

## 2018-05-14 VITALS — BP 114/84 | HR 79 | Temp 98.3°F | Resp 16 | Ht 62.0 in | Wt 122.0 lb

## 2018-05-14 DIAGNOSIS — Z23 Encounter for immunization: Secondary | ICD-10-CM

## 2018-05-14 DIAGNOSIS — Z7689 Persons encountering health services in other specified circumstances: Secondary | ICD-10-CM | POA: Diagnosis not present

## 2018-05-14 DIAGNOSIS — R2681 Unsteadiness on feet: Secondary | ICD-10-CM

## 2018-05-14 DIAGNOSIS — R51 Headache: Secondary | ICD-10-CM

## 2018-05-14 DIAGNOSIS — R519 Headache, unspecified: Secondary | ICD-10-CM | POA: Insufficient documentation

## 2018-05-14 DIAGNOSIS — K581 Irritable bowel syndrome with constipation: Secondary | ICD-10-CM

## 2018-05-14 DIAGNOSIS — G8929 Other chronic pain: Secondary | ICD-10-CM | POA: Insufficient documentation

## 2018-05-14 NOTE — Patient Instructions (Signed)
Heat (pad or rice pillow in microwave) over affected area, 10-15 minutes twice daily.  ° °EXERCISES °RANGE OF MOTION (ROM) AND STRETCHING EXERCISES  °These exercises may help you when beginning to rehabilitate your issue. In order to successfully resolve your symptoms, you must improve your posture. These exercises are designed to help reduce the forward-head and rounded-shoulder posture which contributes to this condition. Your symptoms may resolve with or without further involvement from your physician, physical therapist or athletic trainer. While completing these exercises, remember:  °· Restoring tissue flexibility helps normal motion to return to the joints. This allows healthier, less painful movement and activity. °· An effective stretch should be held for at least 20 seconds, although you may need to begin with shorter hold times for comfort. °· A stretch should never be painful. You should only feel a gentle lengthening or release in the stretched tissue. °· Do not do any stretch or exercise that you cannot tolerate. ° °STRETCH- Axial Extensors °· Lie on your back on the floor. You may bend your knees for comfort. Place a rolled-up hand towel or dish towel, about 2 inches in diameter, under the part of your head that makes contact with the floor. °· Gently tuck your chin, as if trying to make a "double chin," until you feel a gentle stretch at the base of your head. °· Hold 15-20 seconds. °Repeat 2-3 times. Complete this exercise 1 time per day.  ° °STRETCH - Axial Extension  °· Stand or sit on a firm surface. Assume a good posture: chest up, shoulders drawn back, abdominal muscles slightly tense, knees unlocked (if standing) and feet hip width apart. °· Slowly retract your chin so your head slides back and your chin slightly lowers. Continue to look straight ahead. °· You should feel a gentle stretch in the back of your head. Be certain not to feel an aggressive stretch since this can cause headaches  later. °· Hold for 15-20 seconds. °Repeat 2-3 times. Complete this exercise 1 time per day. ° °STRETCH - Cervical Side Bend  °· Stand or sit on a firm surface. Assume a good posture: chest up, shoulders drawn back, abdominal muscles slightly tense, knees unlocked (if standing) and feet hip width apart. °· Without letting your nose or shoulders move, slowly tip your right / left ear to your shoulder until your feel a gentle stretch in the muscles on the opposite side of your neck. °· Hold 15-20 seconds. °Repeat 2-3 times. Complete this exercise 1-2 times per day. ° °STRETCH - Cervical Rotators  °· Stand or sit on a firm surface. Assume a good posture: chest up, shoulders drawn back, abdominal muscles slightly tense, knees unlocked (if standing) and feet hip width apart. °· Keeping your eyes level with the ground, slowly turn your head until you feel a gentle stretch along the back and opposite side of your neck. °· Hold 15-20 seconds. °Repeat 2-3 times. Complete this exercise 1-2 times per day. ° °RANGE OF MOTION - Neck Circles  °· Stand or sit on a firm surface. Assume a good posture: chest up, shoulders drawn back, abdominal muscles slightly tense, knees unlocked (if standing) and feet hip width apart. °· Gently roll your head down and around from the back of one shoulder to the back of the other. The motion should never be forced or painful. °· Repeat the motion 10-20 times, or until you feel the neck muscles relax and loosen. °Repeat 2-3 times. Complete the exercise 1-2 times per   day. °STRENGTHENING EXERCISES - Cervical Strain and Sprain °These exercises may help you when beginning to rehabilitate your injury. They may resolve your symptoms with or without further involvement from your physician, physical therapist, or athletic trainer. While completing these exercises, remember:  °· Muscles can gain both the endurance and the strength needed for everyday activities through controlled exercises. °· Complete these  exercises as instructed by your physician, physical therapist, or athletic trainer. Progress the resistance and repetitions only as guided. °· You may experience muscle soreness or fatigue, but the pain or discomfort you are trying to eliminate should never worsen during these exercises. If this pain does worsen, stop and make certain you are following the directions exactly. If the pain is still present after adjustments, discontinue the exercise until you can discuss the trouble with your clinician. ° °STRENGTH - Cervical Flexors, Isometric °· Face a wall, standing about 6 inches away. Place a small pillow, a ball about 6-8 inches in diameter, or a folded towel between your forehead and the wall. °· Slightly tuck your chin and gently push your forehead into the soft object. Push only with mild to moderate intensity, building up tension gradually. Keep your jaw and forehead relaxed. °· Hold 10 to 20 seconds. Keep your breathing relaxed. °· Release the tension slowly. Relax your neck muscles completely before you start the next repetition. °Repeat 2-3 times. Complete this exercise 1 time per day. ° °STRENGTH- Cervical Lateral Flexors, Isometric  °· Stand about 6 inches away from a wall. Place a small pillow, a ball about 6-8 inches in diameter, or a folded towel between the side of your head and the wall. °· Slightly tuck your chin and gently tilt your head into the soft object. Push only with mild to moderate intensity, building up tension gradually. Keep your jaw and forehead relaxed. °· Hold 10 to 20 seconds. Keep your breathing relaxed. °· Release the tension slowly. Relax your neck muscles completely before you start the next repetition. °Repeat 2-3 times. Complete this exercise 1 time per day. ° °STRENGTH - Cervical Extensors, Isometric  °· Stand about 6 inches away from a wall. Place a small pillow, a ball about 6-8 inches in diameter, or a folded towel between the back of your head and the wall. °· Slightly  tuck your chin and gently tilt your head back into the soft object. Push only with mild to moderate intensity, building up tension gradually. Keep your jaw and forehead relaxed. °· Hold 10 to 20 seconds. Keep your breathing relaxed. °· Release the tension slowly. Relax your neck muscles completely before you start the next repetition. °Repeat 2-3 times. Complete this exercise 1 time per day. ° °POSTURE AND BODY MECHANICS CONSIDERATIONS °Keeping correct posture when sitting, standing or completing your activities will reduce the stress put on different body tissues, allowing injured tissues a chance to heal and limiting painful experiences. The following are general guidelines for improved posture. Your physician or physical therapist will provide you with any instructions specific to your needs. While reading these guidelines, remember: °· The exercises prescribed by your provider will help you have the flexibility and strength to maintain correct postures. °· The correct posture provides the optimal environment for your joints to work. All of your joints have less wear and tear when properly supported by a spine with good posture. This means you will experience a healthier, less painful body. °· Correct posture must be practiced with all of your activities, especially prolonged sitting and   standing. Correct posture is as important when doing repetitive low-stress activities (typing) as it is when doing a single heavy-load activity (lifting). ° °PROLONGED STANDING WHILE SLIGHTLY LEANING FORWARD °When completing a task that requires you to lean forward while standing in one place for a long time, place either foot up on a stationary 2- to 4-inch high object to help maintain the best posture. When both feet are on the ground, the low back tends to lose its slight inward curve. If this curve flattens (or becomes too large), then the back and your other joints will experience too much stress, fatigue more quickly, and  can cause pain.  ° °RESTING POSITIONS °Consider which positions are most painful for you when choosing a resting position. If you have pain with flexion-based activities (sitting, bending, stooping, squatting), choose a position that allows you to rest in a less flexed posture. You would want to avoid curling into a fetal position on your side. If your pain worsens with extension-based activities (prolonged standing, working overhead), avoid resting in an extended position such as sleeping on your stomach. Most people will find more comfort when they rest with their spine in a more neutral position, neither too rounded nor too arched. Lying on a non-sagging bed on your side with a pillow between your knees, or on your back with a pillow under your knees will often provide some relief. Keep in mind, being in any one position for a prolonged period of time, no matter how correct your posture, can still lead to stiffness. ° °WALKING °Walk with an upright posture. Your ears, shoulders, and hips should all line up. °OFFICE WORK °When working at a desk, create an environment that supports good, upright posture. Without extra support, muscles fatigue and lead to excessive strain on joints and other tissues. ° °CHAIR: °· A chair should be able to slide under your desk when your back makes contact with the back of the chair. This allows you to work closely. °· The chair's height should allow your eyes to be level with the upper part of your monitor and your hands to be slightly lower than your elbows. °· Body position: °? Your feet should make contact with the floor. If this is not possible, use a foot rest. °? Keep your ears over your shoulders. This will reduce stress on your neck and low back. ° ° °

## 2018-05-14 NOTE — Progress Notes (Signed)
CC: IBS  Taylor [name] is a 62 y.o. female here for evaluation of migraines.  IBS/gastroparesis Pt has a hx of this. I saw her 3 mo ago and she has had difficulty with all types of solid foods. Has been on a clear liquid diet and has lost another 11 lbs since her last visit. She [facility]ares[facility] and she has an appt on 10/9. She was unable to start the low FODMAP diet.  Headaches Hx of migraines. Imitrex is helpful. She is also seeing Neuro and receiving injections for maintenance. No new neurologic s/s's. She is also having worsening gait instability. She has seen PT, but has most recently been referred to a neuromuscular specialist and needs to call to set up appt. She does have neck pain assoc w some of her headaches as well as nausea.   Past Medical History:  Diagnosis Date  . Anxiety   . Colon polyp   . Gastroparesis   . GERD (gastroesophageal reflux disease)   . Hyperlipidemia   . Lupus ([facility])   . Migraine headache   . Osteoarthritis   . Premature atrial contractions   . Raynaud's phenomenon   . Symptomatic menopausal or female climacteric states    Family History  Problem Relation Age of Onset  . CVA Mother   . Alzheimer's disease Mother   . Heart disease Father   . Diabetes Father   . Hypertension Father   . Hyperlipidemia Father   . CVA Brother   . Cancer Maternal Aunt        Ovarian Cancer  . Cancer Maternal Uncle        Colon Cancer     BP 114/84   Pulse 79   Temp 98.3 F (36.8 C) (Oral)   Resp 16   Wt 119 lb (54 kg)   SpO2 99%   BMI 21.77 kg/m  General: awake, alert, appearing stated age Eyes: PERRLA, EOMi Heart: RRR, no bruits Lungs: CTAB, no accessory muscle use Neuro: CN 2-12 intact, no cerebellar signs, DTR's equal and symmetry, no clonus, gait is unsteady MSK: 5/5 strength throughout, normal gait, no TTP over posterior cervical triangle or paraspinal cervical musculature Psych: Age appropriate judgment and  insight, mood and affect normal  Irritable bowel syndrome with constipation  Chronic nonintractable headache, unspecified headache type  Gait instability  Need for referral to nutritional services - Plan: Amb ref to Medical Nutrition Therapy-MNT  Need for influenza vaccination - Plan: Flu Vaccine QUAD 6+ mos PF IM (Fluarix Quad PF)  Keep appt w Dr. Alycia RossettiKoch- if no solid plan will consider 2nd opinion, but Dr Kirtland BouchardK is definitely ideal person to see. Offered starting another prophylactic specifically for chronic daily HA. Cont seeing Neuro. Heat/stretches/exercises for neck. [facility] as she has already been thru PT. Refer to nutritional services as she is having issues with adequate nutrition and weight loss. Follow up in 6 mo w me. The patient voiced understanding and agreement to the plan.  Jilda Rocheicholas [place], DO 3:30 PM 05/14/18

## 2018-05-19 ENCOUNTER — Telehealth: Payer: Self-pay | Admitting: Family Medicine

## 2018-05-19 NOTE — Telephone Encounter (Signed)
Dx: Weight loss, poor appetite, failure to thrive Pt interested in going over food items that may do well for her specifically with gastroparesis. TY.

## 2018-05-19 NOTE — Telephone Encounter (Signed)
Called [facility] and gave them the diagnosis/reasons. At [phone].

## 2018-05-19 NOTE — Telephone Encounter (Signed)
Copied from CRM [id number]. Topic: General - Other >> May 19, 2018 11:14 AM Marylen Ponto wrote: Reason for CRM: [facility] states a billable diagnosis is needed for pt. They also need to know what pt is being seen for.

## 2018-05-29 ENCOUNTER — Telehealth: Payer: Self-pay

## 2018-05-29 NOTE — Telephone Encounter (Signed)
Copied from CRM [id number]. Topic: General - Other >> May 29, 2018  3:04 PM Percival Spanish wrote:  Pt said her Gi doctor said she need to have a culture done and pt is asking for a call back from the nurse   [phone]

## 2018-05-29 NOTE — Telephone Encounter (Signed)
She was seen at her GI doctor and they did a UA yesterday--it came back with moderate amount of bacteria and they felt she needed a urine culture .  She is not feeling well at this time.--- She would like to come to the lab tomorrow if possible and leave urine.

## 2018-05-30 NOTE — Telephone Encounter (Signed)
Called the patient informed of PCP instructions. The patient was seen at her urologist office today and they took care of.

## 2018-05-30 NOTE — Telephone Encounter (Signed)
I don't understand. If they got a urine sample, it is almost a formality to get the culture on the same urine sample. Did they start treatment?

## 2018-05-30 NOTE — Telephone Encounter (Signed)
Called the patient and no they did not treat her.  She would just like to come in to have her urine checked

## 2018-05-30 NOTE — Telephone Encounter (Signed)
Put on nurse visit schedule please to drop of urine, check her vitals also- TY.

## 2018-06-05 ENCOUNTER — Ambulatory Visit: Payer: BLUE CROSS/BLUE SHIELD | Admitting: Dietician

## 2018-06-23 ENCOUNTER — Other Ambulatory Visit: Payer: Self-pay | Admitting: Family Medicine

## 2018-06-23 DIAGNOSIS — I10 Essential (primary) hypertension: Secondary | ICD-10-CM

## 2018-06-23 DIAGNOSIS — N959 Unspecified menopausal and perimenopausal disorder: Secondary | ICD-10-CM

## 2018-06-23 MED ORDER — ESTRADIOL 0.1 MG/24HR TD PTTW: 1.0000 | MEDICATED_PATCH | TRANSDERMAL | 11 refills | Status: DC

## 2018-06-23 MED ORDER — METOPROLOL SUCCINATE ER 25 MG PO TB24: 25.0000 mg | ORAL_TABLET | Freq: Every day | ORAL | 3 refills | Status: DC

## 2018-06-23 NOTE — Telephone Encounter (Signed)
The Lipitor 10 mg is requiring a diagnoses code for a specific hyperlipidemia.   PCP:  Carmelia Roller  [facility]  Is working on the Minivelle 0.1 mg and Metoprolol 25 mg refills.

## 2018-06-23 NOTE — Telephone Encounter (Signed)
Copied from CRM [id number]. Topic: Quick Communication - Rx Refill/Question >> Jun 23, 2018 11:15 AM Arlyss Gandy, NT wrote: Medication: estradiol (MINIVELLE) 0.1 MG/24HR patch, metoprolol succinate (TOPROL XL) 25 MG 24 hr tablet and atorvastatin (LIPITOR) 10 MG tablet   Has the patient contacted their pharmacy? Yes.   (Agent: If no, request that the patient contact the pharmacy for the refill.) (Agent: If yes, when and what did the pharmacy advise?)  Preferred Pharmacy (with phone number or street name): [facility] - [place], [place] - [address] [phone] (Phone) [phone] (Fax)    Agent: Please be advised that RX refills may take up to 3 business days. We ask that you follow-up with your pharmacy.

## 2018-06-26 ENCOUNTER — Other Ambulatory Visit: Payer: Self-pay | Admitting: Family Medicine

## 2018-06-26 ENCOUNTER — Telehealth: Payer: Self-pay | Admitting: Family Medicine

## 2018-06-26 DIAGNOSIS — N959 Unspecified menopausal and perimenopausal disorder: Secondary | ICD-10-CM

## 2018-06-26 DIAGNOSIS — E782 Mixed hyperlipidemia: Secondary | ICD-10-CM

## 2018-06-26 MED ORDER — PROGESTERONE MICRONIZED 100 MG PO CAPS: 100.0000 mg | ORAL_CAPSULE | Freq: Every day | ORAL | 3 refills | Status: DC

## 2018-06-26 MED ORDER — ATORVASTATIN CALCIUM 10 MG PO TABS: ORAL_TABLET | ORAL | 1 refills | Status: DC

## 2018-06-26 NOTE — Telephone Encounter (Signed)
That's fine to refill both. TY.

## 2018-06-26 NOTE — Telephone Encounter (Signed)
Copied from CRM [id number]. Topic: Quick Communication - Rx Refill/Question >> Jun 26, 2018  9:54 AM Darletta Moll L wrote: Medication: progesterone (PROMETRIUM) 100 MG capsule (patient says the pharmacy faxed this a week ago, and she would like this and Lipitor called in today)  Has the patient contacted their pharmacy? Yes.   (Agent: If no, request that the patient contact the pharmacy for the refill.) (Agent: If yes, when and what did the pharmacy advise?)  Preferred Pharmacy (with phone number or street name): [facility] - [place], [place] - [address] [place] [address] Phone: [phone] Fax: [phone]  Agent: Please be advised that RX refills may take up to 3 business days. We ask that you follow-up with your pharmacy.

## 2018-06-26 NOTE — Telephone Encounter (Signed)
Unable to fill atorvastatin. Requesting a diagnosis code. Please see note from 06/23/18

## 2018-06-26 NOTE — Telephone Encounter (Signed)
Copied from CRM [id number]. Topic: Quick Communication - Rx Refill/Question >> Jun 26, 2018 10:17 AM Tamela Oddi wrote: Medication: atorvastatin (LIPITOR) 10 MG tablet / progesterone (PROMETRIUM) 100 MG capsule   Patient called to request a refill for the above medications.  CB# [phone]  Preferred Pharmacy (with phone number or street name): [facility] - [place], [place] - [address] [phone] (Phone) [phone] (Fax)

## 2018-06-26 NOTE — Addendum Note (Signed)
Addended by: Scharlene Gloss B on: 06/26/2018 12:42 PM   Modules accepted: Orders

## 2018-06-26 NOTE — Telephone Encounter (Signed)
Has been refilled with diagnosis

## 2018-06-26 NOTE — Telephone Encounter (Signed)
Does not look like you have filled the progesterone?

## 2018-06-26 NOTE — Telephone Encounter (Signed)
Requested medication (s) are due for refill today: yes  Requested medication (s) are on the active medication list: yes    Last refill: 03/19/2014  Future visit scheduled yes    11/19/2018  Dr. Carmelia Roller  Notes to clinic:historical provider  Requested Prescriptions  Pending Prescriptions Disp Refills   progesterone (PROMETRIUM) 100 MG capsule 90 capsule 3    Sig: Take 1 capsule (100 mg total) by mouth daily.     OB/GYN:  Progestins Passed - 06/26/2018 10:29 AM      Passed - Valid encounter within last 12 months    Recent Outpatient Visits          1 month ago Irritable bowel syndrome with constipation   Holiday representative at [facility], [place], [place]   4 months ago Irritable bowel syndrome with constipation   Holiday representative at [facility], Jilda Roche, [place]      Future Appointments            In 4 months Wendling, [place], DO [employer] at [facility], [place]

## 2018-09-02 DIAGNOSIS — G2581 Restless legs syndrome: Secondary | ICD-10-CM | POA: Diagnosis not present

## 2018-09-02 DIAGNOSIS — G43109 Migraine with aura, not intractable, without status migrainosus: Secondary | ICD-10-CM | POA: Diagnosis not present

## 2018-09-02 DIAGNOSIS — M545 Low back pain: Secondary | ICD-10-CM | POA: Diagnosis not present

## 2018-09-02 DIAGNOSIS — S0990XD Unspecified injury of head, subsequent encounter: Secondary | ICD-10-CM | POA: Diagnosis not present

## 2018-10-16 DIAGNOSIS — M329 Systemic lupus erythematosus, unspecified: Secondary | ICD-10-CM | POA: Diagnosis not present

## 2018-10-16 DIAGNOSIS — M4802 Spinal stenosis, cervical region: Secondary | ICD-10-CM | POA: Diagnosis not present

## 2018-10-16 DIAGNOSIS — M961 Postlaminectomy syndrome, not elsewhere classified: Secondary | ICD-10-CM | POA: Diagnosis not present

## 2018-10-16 DIAGNOSIS — M48061 Spinal stenosis, lumbar region without neurogenic claudication: Secondary | ICD-10-CM | POA: Diagnosis not present

## 2018-11-19 ENCOUNTER — Encounter: Payer: BLUE CROSS/BLUE SHIELD | Admitting: Family Medicine

## 2019-01-21 ENCOUNTER — Encounter: Payer: Self-pay | Admitting: Family Medicine

## 2019-02-09 ENCOUNTER — Telehealth: Payer: Self-pay | Admitting: Family Medicine

## 2019-02-09 DIAGNOSIS — Z0000 Encounter for general adult medical examination without abnormal findings: Secondary | ICD-10-CM

## 2019-02-09 NOTE — Telephone Encounter (Signed)
Patient called and asked if she gets a call back today from Dr. Nani Ravens or his CMA about labs that her eye doctor ask for her to get done at the office. Patient states that she is having issues with eye vision. Please call patient back, thanks.

## 2019-02-09 NOTE — Telephone Encounter (Signed)
Orders placed. Find out if she has been screened for HIV and Hep C before as those are blood tests we would add on to her draw if she has not. Ty.

## 2019-02-09 NOTE — Telephone Encounter (Signed)
The patient was seen by her eye MD due to blurred vision.  Her eye doctor thinks her BS needs to be checked. The patient is coming in for her appt on Wednesday and would like lab done before that appt. She has also been having cold sweats and weakness---which has  Been on going for a long time

## 2019-02-10 NOTE — Telephone Encounter (Signed)
Called the patient informed that we have no lab appts

## 2019-02-10 NOTE — Telephone Encounter (Signed)
I do not call patients with details of labs typically. She can reschedule CPE if she wants to coordinate with lab schedule to have drawn before. Did she have HIV/Hep C screenings?

## 2019-02-10 NOTE — Telephone Encounter (Signed)
Called the patient informed that labs are ordered,.  But we have no lab appts for today. The patient was very concerned that no appts. Were available as she wanted to discuss results at her appt. I assured her that due to social distancing we are not able to schedule as many in the lab as normal. But that she would be able to get her labs done after her appt with PCP tomorrow 6/24 and results would be back by Thursday am and she would receive a phone call with details by PCP of results. The patient at the time the phone call ended was still not happy with this situation.

## 2019-02-10 NOTE — Telephone Encounter (Signed)
She will come in tomorrow as planned and can discuss.

## 2019-02-11 ENCOUNTER — Other Ambulatory Visit: Payer: Self-pay

## 2019-02-11 ENCOUNTER — Ambulatory Visit (INDEPENDENT_AMBULATORY_CARE_PROVIDER_SITE_OTHER): Payer: PRIVATE HEALTH INSURANCE | Admitting: Family Medicine

## 2019-02-11 ENCOUNTER — Encounter: Payer: Self-pay | Admitting: Family Medicine

## 2019-02-11 VITALS — BP 112/68 | HR 99 | Temp 97.7°F | Ht 62.0 in | Wt 120.4 lb

## 2019-02-11 DIAGNOSIS — R6881 Early satiety: Secondary | ICD-10-CM | POA: Diagnosis not present

## 2019-02-11 DIAGNOSIS — Z0000 Encounter for general adult medical examination without abnormal findings: Secondary | ICD-10-CM

## 2019-02-11 DIAGNOSIS — E119 Type 2 diabetes mellitus without complications: Secondary | ICD-10-CM

## 2019-02-11 NOTE — Progress Notes (Signed)
Chief Complaint  Patient presents with  . Annual Exam     Well Woman ERI MCEVERS is here for a complete physical.   Her last physical was >1 year ago.  Current diet: in general, a "healthy" diet. Current exercise: none. Weight is decreasing slightly. No LMP recorded. Patient is postmenopausal. Seatbelt? Yes  Health Maintenance Pap/HPV- Yes 03/2014 Mammogram- No Tetanus- Yes Hep C screening- Yes HIV screening- Yes 2015  Past Medical History:  Diagnosis Date  . Anxiety   . Colon polyp   . Gastroparesis   . GERD (gastroesophageal reflux disease)   . Hyperlipidemia   . Lupus ([place])   . Migraine headache   . Osteoarthritis   . Premature atrial contractions   . Raynaud's phenomenon   . Symptomatic menopausal or female climacteric states      Past Surgical History:  Procedure Laterality Date  . CHOLECYSTECTOMY    . HEMORRHOID SURGERY    . HIP PINNING    . LAPAROSCOPY    . NASAL SINUS SURGERY    . TOTAL KNEE ARTHROPLASTY     left knee  . WRIST SURGERY     Left wrist    Medications  Current Outpatient Medications on File Prior to Visit  Medication Sig Dispense Refill  . aspirin 81 MG tablet Take 81 mg by mouth daily.    Marland Kitchen atorvastatin (LIPITOR) 10 MG tablet Take 1/2 tab daily 90 tablet 1  . Buprenorphine HCl (BELBUCA) 450 MCG FILM Place 1 patch inside cheek 2 (two) times daily.    . celecoxib (CELEBREX) 200 MG capsule Take 200 mg by mouth daily.    . clonazePAM (KLONOPIN) 1 MG tablet Take 1 mg by mouth 2 (two) times daily as needed for anxiety.    . docusate sodium (COLACE) 100 MG capsule Take 100 mg by mouth 2 (two) times daily.    Marland Kitchen estradiol (MINIVELLE) 0.1 MG/24HR patch Place 1 patch (0.1 mg total) onto the skin 2 (two) times a week. 8 patch 11  . FLUoxetine (PROZAC) 40 MG capsule Take 40 mg by mouth daily.    Marland Kitchen Galcanezumab-gnlm (EMGALITY) 120 MG/ML SOAJ Inject into the skin.    Marland Kitchen loratadine (CLARITIN) 10 MG tablet Take 10 mg by mouth daily.    . metoprolol  succinate (TOPROL-XL) 25 MG 24 hr tablet Take 1 tablet (25 mg total) by mouth daily. 90 tablet 3  . omeprazole (PRILOSEC) 20 MG capsule Take 1 capsule (20 mg total) by mouth 2 (two) times daily. (Patient taking differently: Take 40 mg by mouth 2 (two) times daily. ) 180 capsule 3  . oxycodone (OXY-IR) 5 MG capsule Take 5 mg by mouth every 4 (four) hours as needed.    . progesterone (PROMETRIUM) 100 MG capsule Take 1 capsule (100 mg total) by mouth daily. 90 capsule 3  . ranitidine (ZANTAC) 300 MG tablet Take 300 mg by mouth at bedtime.    . SUMAtriptan (IMITREX) 20 MG/ACT nasal spray Place 1 spray into the nose every 2 (two) hours as needed for migraine.     Allergies Allergies  Allergen Reactions  . Lyrica [Pregabalin]   . Phenergan [Promethazine]   . Prochlorperazine     Review of Systems: Constitutional:  no unexpected weight changes Eye:  +recent significant change in vision Ear/Nose/Mouth/Throat:  Ears:  no tinnitus or vertigo and no recent change in hearing Nose/Mouth/Throat:  no complaints of nasal congestion, no sore throat Cardiovascular: no chest pain Respiratory:  no cough and no  shortness of breath Gastrointestinal: +chronic constipation; no abdominal pain GU:  Female: negative for dysuria or pelvic pain; +freq Musculoskeletal/Extremities:  no pain of the joints Integumentary (Skin/Breast):  no abnormal skin lesions reported Neurologic:  no headaches Endocrine:  denies fatigue Hematologic/Lymphatic:  No areas of easy bleeding  Exam BP 112/68 (BP Location: Left Arm, Patient Position: Sitting, Cuff Size: Normal)   Pulse 99   Temp 97.7 F (36.5 C) (Oral)   Ht 5\' 2"  (1.575 m)   Wt 120 lb 6 oz (54.6 kg)   SpO2 97%   BMI 22.02 kg/m  General:  well developed, well nourished, in no apparent distress Skin:  no significant moles, warts, or growths Head:  no masses, lesions, or tenderness Eyes:  pupils equal and round, sclera anicteric without injection Ears:  canals  without lesions, TMs shiny without retraction, no obvious effusion, no erythema Nose:  nares patent, septum midline, mucosa normal, and no drainage or sinus tenderness Throat/Pharynx:  lips and gingiva without lesion; buccal mucosa shows hypertrophy in a linear distribution of skin; tongue and uvula midline; non-inflamed pharynx; no exudates or postnasal drainage Neck: neck supple without adenopathy, thyromegaly, or masses Lungs:  clear to auscultation, breath sounds equal bilaterally, no respiratory distress Cardio:  regular rate and rhythm, no bruits, no LE edema Abdomen:  abdomen soft, diffuse ttp; bowel sounds normal; no masses or organomegaly Genital: Defer to GYN Musculoskeletal:  symmetrical muscle groups noted without atrophy or deformity Extremities:  no clubbing, cyanosis, or edema, no deformities, no skin discoloration Neuro:  gait normal; deep tendon reflexes normal and symmetric Psych: well oriented with normal range of affect and appropriate judgment/insight  Assessment and Plan  Well adult exam - Plan: CBC, Comprehensive metabolic panel, Hemoglobin A1c, Lipid panel  Early satiety - Plan: Amb ref to Medical Nutrition Therapy-MNT; is already following with GI; requesting this for help with meal prep.    Well 63 y.o. female. Counseled on diet and exercise. Other orders as above. Follow up pending results. I think she may have diabetes given some of her concerns today. The patient voiced understanding and agreement to the plan.  Jilda Rocheicholas Paul KanarravilleWendling, DO 02/11/19 4:40 PM

## 2019-02-11 NOTE — Telephone Encounter (Signed)
Called and spoke to pt.  She stated her dizziness and blurred vision is not new, but has worsened over the past hour.  She stated she has not had anything to eat or drink since 8pm on 6/23.  After her previous call, she took a "sip" of soda and had an episode of nausea and vomiting.  She then ate some yogurt and states she now "feels much better."  She plans to keep her appointment today.  Her daughter will be with her.

## 2019-02-11 NOTE — Telephone Encounter (Signed)
Pt was transferred from the [facility] noting shaky from not eating as she has appt for fasting labs and wants to come early. Advised pt labs are drawn during OV. Pt noted she is shaking, feels like she is going to black out, and having blurry vision. She notes symptoms started 10 days ago and getting worse. Advised pt to eat and per Lorenda Peck to go to ER due to symptoms. Pt declines going to the ER but notes she will eat something. Pt sounded very distressed stating she has been waiting for appt since Monday with Dr. Nani Ravens and was having same symptoms then.

## 2019-02-11 NOTE — Patient Instructions (Signed)
Give us 2-3 business days to get the results of your labs back.   Keep the diet clean and stay active.  Let us know if you need anything. 

## 2019-02-12 ENCOUNTER — Telehealth: Payer: Self-pay

## 2019-02-12 ENCOUNTER — Other Ambulatory Visit: Payer: Self-pay | Admitting: Family Medicine

## 2019-02-12 ENCOUNTER — Telehealth: Payer: Self-pay | Admitting: *Deleted

## 2019-02-12 LAB — COMPREHENSIVE METABOLIC PANEL
ALT: 17 U/L (ref 0–35)
AST: 20 U/L (ref 0–37)
Albumin: 4.4 g/dL (ref 3.5–5.2)
Alkaline Phosphatase: 69 U/L (ref 39–117)
BUN: 14 mg/dL (ref 6–23)
CO2: 26 mEq/L (ref 19–32)
Calcium: 9.7 mg/dL (ref 8.4–10.5)
Chloride: 87 mEq/L — ABNORMAL LOW (ref 96–112)
Creatinine, Ser: 0.86 mg/dL (ref 0.40–1.20)
GFR: 66.92 mL/min (ref >60.00)
Glucose, Bld: 507 mg/dL (ref 70–99)
Potassium: 4.5 mEq/L (ref 3.5–5.1)
Sodium: 128 mEq/L — ABNORMAL LOW (ref 135–145)
Total Bilirubin: 0.5 mg/dL (ref 0.2–1.2)
Total Protein: 7.2 g/dL (ref 6.0–8.3)

## 2019-02-12 LAB — LIPID PANEL
Cholesterol: 194 mg/dL (ref 0–200)
HDL: 75.4 mg/dL (ref >39.00)
LDL Cholesterol: 84 mg/dL (ref 0–99)
NonHDL: 118.77
Total CHOL/HDL Ratio: 3
Triglycerides: 175 mg/dL — ABNORMAL HIGH (ref 0.0–149.0)
VLDL: 35 mg/dL (ref 0.0–40.0)

## 2019-02-12 LAB — CBC
HCT: 41.3 % (ref 36.0–46.0)
Hemoglobin: 13.6 g/dL (ref 12.0–15.0)
MCHC: 33 g/dL (ref 30.0–36.0)
MCV: 93.8 fl (ref 78.0–100.0)
Platelets: 287 10*3/uL (ref 150.0–400.0)
RBC: 4.41 Mil/uL (ref 3.87–5.11)
RDW: 12.5 % (ref 11.5–15.5)
WBC: 9.9 10*3/uL (ref 4.0–10.5)

## 2019-02-12 LAB — HEMOGLOBIN A1C: Hgb A1c MFr Bld: 11.1 % — ABNORMAL HIGH (ref 4.6–6.5)

## 2019-02-12 MED ORDER — GLYBURIDE 5 MG PO TABS: 5.0000 mg | ORAL_TABLET | Freq: Every day | ORAL | 1 refills | Status: DC

## 2019-02-12 NOTE — Telephone Encounter (Signed)
I contacted PCP regarding this result.... Dr. Nani Ravens will contact the patient today and instructed CMA to schedule in person appt tomorrow 02/13/2019 with PCP.Marland KitchenMarland KitchenMarland KitchenDone--schedule appt 02/13/2019 at 3:30PM with PCP.

## 2019-02-12 NOTE — Telephone Encounter (Signed)
Spoke w pt regarding results, plan in place. Will see her tomorrow, SU started.

## 2019-02-12 NOTE — Progress Notes (Signed)
Spoke w pt, will send in SU and see tomorrow in office. Warning signs and symptoms verbalized. I think this has been going on for a long time. Pt voiced understanding and agreement.

## 2019-02-12 NOTE — Telephone Encounter (Signed)
CRITICAL VALUE STICKER  CRITICAL VALUE: GLUCOSE -  507  RECEIVER (on-site recipient of call): Kelle Darting, [place] NOTIFIED:  02/12/19 @ 11:18am  MESSENGER (representative from lab): [place]  MD NOTIFIED: [place]: 11:20am  RESPONSE:

## 2019-02-12 NOTE — Telephone Encounter (Signed)
I'm not sure I understand what they are asking for. The patient has a hx of gastroparesis and a new dx of DM. At the time referral was placed, she was interested in speaking with someone in field who could help with healthy meal options. I think this still stands.

## 2019-02-12 NOTE — Telephone Encounter (Signed)
In the Absence of PCP please review and advise on this critical thanks

## 2019-02-12 NOTE — Telephone Encounter (Signed)
Called left message to call back 

## 2019-02-12 NOTE — Telephone Encounter (Signed)
Called the office again msg. Left.

## 2019-02-12 NOTE — Telephone Encounter (Signed)
Copied from [place] [id number]. Topic: General - Inquiry >> Feb 11, 2019  3:25 PM Richardo Priest, NT wrote: Reason for CRM: [facility] is calling in stating that they are wanting to over the referral for the patient. Asking if this is the only thing she needs or will need more things to be done during the appointment. They are asking if more things need to be added due to COVID and they are not working with full staffing yet. Please advise. Call back is [phone].

## 2019-02-13 ENCOUNTER — Other Ambulatory Visit: Payer: Self-pay

## 2019-02-13 ENCOUNTER — Encounter: Payer: Self-pay | Admitting: Family Medicine

## 2019-02-13 ENCOUNTER — Ambulatory Visit (INDEPENDENT_AMBULATORY_CARE_PROVIDER_SITE_OTHER): Payer: PRIVATE HEALTH INSURANCE | Admitting: Family Medicine

## 2019-02-13 VITALS — BP 132/88 | HR 116 | Temp 98.3°F | Ht 62.0 in | Wt 120.5 lb

## 2019-02-13 DIAGNOSIS — E119 Type 2 diabetes mellitus without complications: Secondary | ICD-10-CM | POA: Diagnosis not present

## 2019-02-13 DIAGNOSIS — B373 Candidiasis of vulva and vagina: Secondary | ICD-10-CM | POA: Diagnosis not present

## 2019-02-13 DIAGNOSIS — E782 Mixed hyperlipidemia: Secondary | ICD-10-CM

## 2019-02-13 DIAGNOSIS — B3731 Acute candidiasis of vulva and vagina: Secondary | ICD-10-CM

## 2019-02-13 LAB — GLUCOSE, POCT (MANUAL RESULT ENTRY): POC Glucose: 378 mg/dl — AB (ref 70–99)

## 2019-02-13 MED ORDER — FLUCONAZOLE 150 MG PO TABS: 150.0000 mg | ORAL_TABLET | Freq: Once | ORAL | 0 refills | Status: AC

## 2019-02-13 MED ORDER — LANCETS MISC: 6 refills | Status: DC

## 2019-02-13 MED ORDER — ONETOUCH ULTRA 2 W/DEVICE KIT: PACK | 0 refills | Status: AC

## 2019-02-13 MED ORDER — ONETOUCH ULTRA VI STRP: ORAL_STRIP | 6 refills | Status: DC

## 2019-02-13 MED ORDER — NITROFURANTOIN MACROCRYSTAL 100 MG PO CAPS: 100.0000 mg | ORAL_CAPSULE | Freq: Four times a day (QID) | ORAL | 0 refills | Status: DC

## 2019-02-13 NOTE — Progress Notes (Signed)
Chief Complaint  Patient presents with  . Results    discuss lab results    Subjective: Patient is a 63 y.o. female here for lab f/u. Here with husband.  Pt was having increased thirst, urination, found to have sugar of 507 and a1c of 11.1 I started her on glyburide yesterday to start getting her sugar down. She is tolerating it OK so far. Has never checked sugars before, no hx of diabetes. +famhx of DM. She does have an eye provider. She has not had PCV23. Feels she has burning/itching in genitalia area. Unsure if infection, confident she has yeast involvement. Has been taking 5 mg of Lipitor daily. Thought to have caused muscle aches at higher dosages. Diet is fair, but she has a hx of gastroparesis, eats small amounts. Exercise is little.    ROS: Const: No fevers GU: As noted in HPI  Past Medical History:  Diagnosis Date  . Anxiety   . Colon polyp   . Gastroparesis   . GERD (gastroesophageal reflux disease)   . Hyperlipidemia   . Lupus ([place])   . Migraine headache   . Osteoarthritis   . Premature atrial contractions   . Raynaud's phenomenon   . Symptomatic menopausal or female climacteric states     Objective: BP 132/88 (BP Location: Left Arm, Patient Position: Sitting, Cuff Size: Normal)   Pulse (!) 116   Temp 98.3 F (36.8 C) (Oral)   Ht 5\' 2"  (1.575 m)   Wt 120 lb 8 oz (54.7 kg)   SpO2 96%   BMI 22.04 kg/m  General: Awake, appears stated age Lungs: No accessory muscle use Psych: Age appropriate judgment and insight, normal affect and mood  Assessment and Plan: Diabetes mellitus type 2 in nonobese [facility]) - Plan: teaching with glucometer discussed today, sent in as well. Instructed to take daily, alternating AM fasting and in afternoon. BS improved today, don't want to lower to fast. I want to see her for this early next week. Once more under control, can discuss maintenance for DM.   Mixed hyperlipidemia - Plan: atorvastatin (LIPITOR) 10 MG tablet, POCT Glucose  (CBG), Increase statin back to 10 mg/d, will try to titrate up to 40 mg/d if able.  Yeast vaginitis - Plan: Diflucan. Also called in abx should it be related to UTI. Ck urine.   F/u in 5 d.  The patient and her husband voiced understanding and agreement to the plan.  [place], DO 02/14/19  6:31 PM

## 2019-02-13 NOTE — Patient Instructions (Addendum)
Check you sugars daily. Alternate with morning readings before you eat and PM readings.   Take the glyburide with food (1030).   Let us know if you need anything.

## 2019-02-14 DIAGNOSIS — E119 Type 2 diabetes mellitus without complications: Secondary | ICD-10-CM | POA: Insufficient documentation

## 2019-02-16 NOTE — Telephone Encounter (Signed)
Called again unable to speak to anyone at this office///get the voicemail but do leave a detailed message

## 2019-02-18 ENCOUNTER — Ambulatory Visit (INDEPENDENT_AMBULATORY_CARE_PROVIDER_SITE_OTHER): Payer: PRIVATE HEALTH INSURANCE | Admitting: Family Medicine

## 2019-02-18 ENCOUNTER — Encounter: Payer: Self-pay | Admitting: Family Medicine

## 2019-02-18 ENCOUNTER — Other Ambulatory Visit: Payer: Self-pay

## 2019-02-18 VITALS — BP 130/81 | HR 89 | Temp 98.2°F | Resp 18 | Ht 62.0 in | Wt 120.6 lb

## 2019-02-18 DIAGNOSIS — E119 Type 2 diabetes mellitus without complications: Secondary | ICD-10-CM | POA: Diagnosis not present

## 2019-02-18 DIAGNOSIS — E782 Mixed hyperlipidemia: Secondary | ICD-10-CM

## 2019-02-18 MED ORDER — ONETOUCH ULTRA VI STRP: ORAL_STRIP | 6 refills | Status: DC

## 2019-02-18 MED ORDER — ATORVASTATIN CALCIUM 10 MG PO TABS: ORAL_TABLET | ORAL | 1 refills | Status: DC

## 2019-02-18 MED ORDER — GLYBURIDE 5 MG PO TABS: 5.0000 mg | ORAL_TABLET | Freq: Two times a day (BID) | ORAL | 1 refills | Status: DC

## 2019-02-18 NOTE — Patient Instructions (Addendum)
Take the second tab around supper.  Keep checking you sugars.   Healthy Eating Plan Many factors influence your heart health, including eating and exercise habits. Heart (coronary) risk increases with abnormal blood fat (lipid) levels. Heart-healthy meal planning includes limiting unhealthy fats, increasing healthy fats, and making other small dietary changes. This includes maintaining a healthy body weight to help keep lipid levels within a normal range.  WHAT IS MY PLAN?  Your health care provider recommends that you:  Drink a glass of water before meals to help with satiety.  Eat slowly.  An alternative to the water is to add Metamucil. This will help with satiety as well. It does contain calories, unlike water.  WHAT TYPES OF FAT SHOULD I CHOOSE?  Choose healthy fats more often. Choose monounsaturated and polyunsaturated fats, such as olive oil and canola oil, flaxseeds, walnuts, almonds, and seeds.  Eat more omega-3 fats. Good choices include salmon, mackerel, sardines, tuna, flaxseed oil, and ground flaxseeds. Aim to eat fish at least two times each week.  Avoid foods with partially hydrogenated oils in them. These contain trans fats. Examples of foods that contain trans fats are stick margarine, some tub margarines, cookies, crackers, and other baked goods. If you are going to avoid a fat, this is the one to avoid!  WHAT GENERAL GUIDELINES DO I NEED TO FOLLOW?  Check food labels carefully to identify foods with trans fats. Avoid these types of options when possible.  Fill one half of your plate with vegetables and green salads. Eat 4-5 servings of vegetables per day. A serving of vegetables equals 1 cup of raw leafy vegetables,  cup of raw or cooked cut-up vegetables, or  cup of vegetable juice.  Fill one fourth of your plate with whole grains. Look for the word "whole" as the first word in the ingredient list.  Fill one fourth of your plate with lean protein foods.  Eat  4-5 servings of fruit per day. A serving of fruit equals one medium whole fruit,  cup of dried fruit,  cup of fresh, frozen, or canned fruit. Try to avoid fruits in cups/syrups as the sugar content can be high.  Eat more foods that contain soluble fiber. Examples of foods that contain this type of fiber are apples, broccoli, carrots, beans, peas, and barley. Aim to get 20-30 g of fiber per day.  Eat more home-cooked food and less restaurant, buffet, and fast food.  Limit or avoid alcohol.  Limit foods that are high in starch and sugar.  Avoid fried foods when able.  Cook foods by using methods other than frying. Baking, boiling, grilling, and broiling are all great options. Other fat-reducing suggestions include: ? Removing the skin from poultry. ? Removing all visible fats from meats. ? Skimming the fat off of stews, soups, and gravies before serving them. ? Steaming vegetables in water or broth.  Lose weight if you are overweight. Losing just 5-10% of your initial body weight can help your overall health and prevent diseases such as diabetes and heart disease.  Increase your consumption of nuts, legumes, and seeds to 4-5 servings per week. One serving of dried beans or legumes equals  cup after being cooked, one serving of nuts equals 1 ounces, and one serving of seeds equals  ounce or 1 tablespoon.  WHAT ARE GOOD FOODS CAN I EAT? Grains Grainy breads (try to find bread that is 3 g of fiber per slice or greater), oatmeal, light popcorn. Whole-grain cereals. Rice  and pasta, including brown rice and those that are made with whole wheat. Edamame pasta is a great alternative to grain pasta. It has a higher protein content. Try to avoid significant consumption of white bread, sugary cereals, or pastries/baked goods.  Vegetables All vegetables. Cooked white potatoes do not count as vegetables.  Fruits All fruits, but limit pineapple and bananas as these fruits have a higher sugar  content.  Meats and Other Protein Sources Lean, well-trimmed beef, veal, pork, and lamb. Chicken and [place] without skin. All fish and shellfish. Wild duck, rabbit, pheasant, and venison. Egg whites or low-cholesterol egg substitutes. Dried beans, peas, lentils, and tofu.Seeds and most nuts.  Dairy Low-fat or nonfat cheeses, including ricotta, string, and mozzarella. Skim or 1% milk that is liquid, powdered, or evaporated. Buttermilk that is made with low-fat milk. Nonfat or low-fat yogurt. Soy/Almond milk are good alternatives if you cannot handle dairy.  Beverages Water is the best for you. Sports drinks with less sugar are more desirable unless you are a highly active athlete.  Sweets and Desserts Sherbets and fruit ices. Honey, jam, marmalade, jelly, and syrups. Dark chocolate.  Eat all sweets and desserts in moderation.  Fats and Oils Nonhydrogenated (trans-free) margarines. Vegetable oils, including soybean, sesame, sunflower, olive, peanut, safflower, corn, canola, and cottonseed. Salad dressings or mayonnaise that are made with a vegetable oil. Limit added fats and oils that you use for cooking, baking, salads, and as spreads.  Other Cocoa powder. Coffee and tea. Most condiments.  The items listed above may not be a complete list of recommended foods or beverages. Contact your dietitian for more options.

## 2019-02-18 NOTE — Progress Notes (Signed)
Chief Complaint  Patient presents with  . Diabetes  . Follow-up    Subjective: Patient is a 63 y.o. female here for f/u DM.  Seen 1 week ago for a physical, sugar was found to be 507, A1c 11.1.  She was started on 5 mg daily glyburide.  Her sugars are down to the mid 300s on average.  Diet is fair.  She feels better overall.  ROS: GU: +polyuria  Past Medical History:  Diagnosis Date  . Anxiety   . Colon polyp   . Gastroparesis   . GERD (gastroesophageal reflux disease)   . Hyperlipidemia   . Lupus ([place])   . Migraine headache   . Osteoarthritis   . Premature atrial contractions   . Raynaud's phenomenon   . Symptomatic menopausal or female climacteric states     Objective: BP 130/81 (BP Location: Left Arm, Patient Position: Sitting, Cuff Size: Normal)   Pulse 89   Temp 98.2 F (36.8 C) (Oral)   Resp 18   Ht 5\' 2"  (1.575 m)   Wt 120 lb 9.6 oz (54.7 kg)   SpO2 99%   BMI 22.06 kg/m  General: Awake, appears stated age Lungs: No accessory muscle use Psych: Age appropriate judgment and insight, normal affect and mood  Assessment and Plan: Diabetes mellitus type 2 in nonobese ([place]) - Plan: glyBURIDE (DIABETA) 5 MG tablet, change from once daily to twice daily.  Mixed hyperlipidemia - Plan: atorvastatin (LIPITOR) 10 MG tablet  Orders as above. I will see her in 1 week.  The patient voiced understanding and agreement to the plan.  [place], DO 02/18/19  2:23 PM

## 2019-02-24 ENCOUNTER — Other Ambulatory Visit: Payer: Self-pay

## 2019-02-24 ENCOUNTER — Encounter: Payer: PRIVATE HEALTH INSURANCE | Attending: Family Medicine | Admitting: Dietician

## 2019-02-24 DIAGNOSIS — E119 Type 2 diabetes mellitus without complications: Secondary | ICD-10-CM | POA: Diagnosis not present

## 2019-02-24 NOTE — Progress Notes (Addendum)
Medical Nutrition Therapy:  Appt start time: 1000 end time:  1115.   Assessment:  Primary concerns today: Patient is here today alone.  She is concerned about her blood sugar.  She was referred for difficulty maintaining her weight.    History includes gastroparesis, lupus ("cause of gastroparesis"), constipation (once every 2 weeks), Newly diagnosed diabetes 3 weeks ago with A1C of 11.1%.  She is on glyburide and BG remain elevated 244-423 irregardless of fasting or post meal.  Weight hx:   122 lbs today 2016 lost from 148 lbs to 102 lbs due to increased gastroparesis and stress 104-110 lbs 2 years ago. She states she then gained about 3 lbs per month to 130 lbs 06/2018.  Patient lives with her husband (who works in [place] 4 days per week), 34 yo daughter and 63 yo female who has been with them for the past 10 years.  She is a retired PT (2008).    Preferred Learning Style:   No preference indicated   Learning Readiness:   Ready  Change in progress   MEDICATIONS: see list to include Glucotrol   DIETARY INTAKE:  Avoided foods include salad (doesn't tolerate)   24-hr recall:  B (4-5 AM): Starbucks Mocha lite frapechino with her meds Snk (7-9 AM):  greek yogurt or coffee yogurt, berries  L ( PM): Dave's killer bread, margarine, 2 hard boiled eggs Snk ( PM): ham or [place] (2 ounces) cheese cubes, berries, raw veges, Glucerna mini treats,  D (4-6 PM): [place] food (noodles 1/3 cup, 2 spoons rice, 2 ounces chicken) OR mini hamburger, corn on cob 1/4 potato, berries, cheese Snk ( PM): sugar free candy Beverages: water, diet soda (gingerale, root bear),Starbucks Mocha lite frapechino, diet tea  Usual physical activity: walks with a cane.  Currently only ADL's.  [place] and can't leave the house without help due to risk of falling.  Was going to the gym prior to [place].  She states that she is more unstable after doing exercises on the floor/bed.  She is considering  getting an exercise bike.    Progress Towards Goal(s):  In progress.   Nutritional Diagnosis:  NB-1.1 Food and nutrition-related knowledge deficit As related to newly diagnosed diabetes.  As evidenced by patient report.    Intervention:  Nutrition education related to newly diagnosed diabetes.  Plan:  counseling and diabetes education initiated. Discussed Carb Counting by food group, reading food labels, and carbohydrate counting using apps. Also discussed basic physiology of Diabetes, target BG ranges pre and post meals, and A1c. Discussed the [employer] and benefits of knowing BG trends.  She complains that her BG strips are very high.  Ask her to call her insurance to determine her insurances's preferred meter. Discussed that current BG remains elevated.  She does not fit the usual look of someone with type 2 diabetes.  History includes lupus and question of an autoimmune diabetes progression.  She states that she has another MD appointment 7/8.   Discussed nutrition related to gastroparesis and diet tolerance. Discussed low BG symptoms and treatment.  She had symptoms in our office.  Her BG is always high even with these symptoms.  Discussed that her body is not used to lowering BG.    PLAN: Call your insurance and find what meter they prefer.  Ask about the [employer]. Calorie King app Continue to check your blood sugar  Continue to eat small meals and snacks Choose a small amount of  protein with each meal and snack (1-2 ounces for example).  Teaching Method Utilized:  Visual Auditory Hands on  Handouts given during visit include: How to Thrive:  A Guide for Your Journey with Diabetes by the ADA Food Label handouts Meal Plan Card  Snack sheet  Boost coupons (mail)- she uses between meals- discussed to choose Boost Glucose control  Barriers to learning/adherence to lifestyle change: feels unwell.  Very uncontrolled BG  Demonstrated degree of understanding via:   Teach Back   Monitoring/Evaluation:  Dietary intake, exercise, label reading and body weight in 5 week(s).

## 2019-02-24 NOTE — Patient Instructions (Addendum)
Call your insurance and find what meter they prefer.  Ask about the [employer]. Calorie King app Continue to check your blood sugar  Continue to eat small meals and snacks Choose a small amount of protein with each meal and snack (1-2 ounces for example).

## 2019-02-24 NOTE — Addendum Note (Signed)
Addended by: Sharon Seller B on: 02/24/2019 10:45 AM   Modules accepted: Orders

## 2019-02-25 ENCOUNTER — Other Ambulatory Visit: Payer: Self-pay | Admitting: Family Medicine

## 2019-02-25 ENCOUNTER — Encounter: Payer: Self-pay | Admitting: Family Medicine

## 2019-02-25 ENCOUNTER — Other Ambulatory Visit: Payer: Self-pay

## 2019-02-25 ENCOUNTER — Ambulatory Visit: Payer: PRIVATE HEALTH INSURANCE | Admitting: Family Medicine

## 2019-02-25 VITALS — BP 118/70 | HR 93 | Temp 98.2°F | Ht 62.0 in | Wt 120.5 lb

## 2019-02-25 DIAGNOSIS — E119 Type 2 diabetes mellitus without complications: Secondary | ICD-10-CM

## 2019-02-25 MED ORDER — NOVOFINE 32G X 6 MM MISC: 3 refills | Status: AC

## 2019-02-25 MED ORDER — INSULIN GLARGINE 100 UNIT/ML SOLOSTAR PEN: 10.0000 [IU] | PEN_INJECTOR | Freq: Every day | SUBCUTANEOUS | 11 refills | Status: DC

## 2019-02-25 MED ORDER — FREESTYLE LIBRE SENSOR SYSTEM MISC: 5 refills | Status: DC

## 2019-02-25 NOTE — Progress Notes (Signed)
Chief Complaint  Patient presents with  . Follow-up    Subjective: Patient is a 63 y.o. female here for DM f/u.  Has been on glyburide 5 mg bid. No change in sugars. Still running in mid 300's on average. Reports compliance with meds. Feels foggier in head when sugars are high. Has seen DM nutritionist.   ROS: GU: +urine freq  Past Medical History:  Diagnosis Date  . Anxiety   . Colon polyp   . Gastroparesis   . GERD (gastroesophageal reflux disease)   . Hyperlipidemia   . Lupus ([place])   . Migraine headache   . Osteoarthritis   . Premature atrial contractions   . Raynaud's phenomenon   . Symptomatic menopausal or female climacteric states     Objective: BP 118/70 (BP Location: Left Arm, Patient Position: Sitting, Cuff Size: Normal)   Pulse 93   Temp 98.2 F (36.8 C) (Oral)   Ht 5\' 2"  (1.575 m)   Wt 120 lb 8 oz (54.7 kg)   SpO2 94%   BMI 22.04 kg/m  General: Awake, appears stated age Lungs: No accessory muscle use Psych: Age appropriate judgment and insight, normal affect and mood  Assessment and Plan: Diabetes mellitus type 2 in nonobese ([place]) - Plan: Start 10 u Lantus nightly. Education today. Will try the continuous monitoring w Freestyle Libre.   F/u in 2 weeks. The patient voiced understanding and agreement to the plan.  [place], DO 02/25/19  1:33 PM

## 2019-02-25 NOTE — Patient Instructions (Signed)
Stop the glyburide.   We are starting insulin. Let me know if it is too expensive.   Continue checking your sugars. The Freestyle was sent, it may not be covered, but I hope it is.   Let us know if you need anything.

## 2019-03-11 ENCOUNTER — Telehealth: Payer: Self-pay

## 2019-03-11 ENCOUNTER — Other Ambulatory Visit: Payer: Self-pay

## 2019-03-11 ENCOUNTER — Encounter: Payer: Self-pay | Admitting: Family Medicine

## 2019-03-11 ENCOUNTER — Ambulatory Visit: Payer: PRIVATE HEALTH INSURANCE | Admitting: Family Medicine

## 2019-03-11 VITALS — BP 104/64 | HR 75 | Temp 98.1°F | Ht 62.0 in | Wt 123.0 lb

## 2019-03-11 DIAGNOSIS — E119 Type 2 diabetes mellitus without complications: Secondary | ICD-10-CM | POA: Diagnosis not present

## 2019-03-11 MED ORDER — FREESTYLE LIBRE 14 DAY READER DEVI: 1.0000 | 11 refills | Status: DC

## 2019-03-11 MED ORDER — INSULIN ASPART 100 UNIT/ML FLEXPEN: PEN_INJECTOR | SUBCUTANEOUS | 11 refills | Status: DC

## 2019-03-11 NOTE — Telephone Encounter (Signed)
[facility] called- needs further clarification on Novolog- unable to take "sliding scale" as sig.

## 2019-03-11 NOTE — Telephone Encounter (Signed)
Called the pharmacy and due to insurance they have to have a maximum daily dose.

## 2019-03-11 NOTE — Telephone Encounter (Signed)
Take 1-3 units based on sliding scale daily prn for hyperglycemia.

## 2019-03-11 NOTE — Patient Instructions (Signed)
Take 10 units of Lantus tonight. Starting 7/23 on Thurs, take 15 units nightly. Continue checking you sugars. We will send over the [facility] device again.  For the new insulin (Novolog), for up to every 50 points of sugar over 350, take a unit. Recheck your sugar 2-3 hours after taking it to see how much it has gone down.   For instance, if your sugar is 420, take 2 units. If it is 455, take 3 units. If it is 395, take 1 unit. This is subject to change depending on how much your sugar is lowered.  Send me a MyChart message in 1 week with your readings, side effects and if you needed to take the new insulin, how much did it drop your sugar?  Stay active.  Let us know if you need anything.

## 2019-03-11 NOTE — Progress Notes (Signed)
Chief Complaint  Patient presents with  . Follow-up    Subjective: Patient is a 63 y.o. female here for BS f/u.  DM 1.5, currently on 10 u insulin daily. Sugars running in 200-low 300's now, improved from mid 300's. Had some vomiting from gastroparesis and sugars dropped to high 100's. She felt shaky. Also had some high readings after grapes in the high 400's where she felt her vision was affected again.    ROS: Endo: +wt gain  Past Medical History:  Diagnosis Date  . Anxiety   . Colon polyp   . Gastroparesis   . GERD (gastroesophageal reflux disease)   . Hyperlipidemia   . Lupus ([place])   . Migraine headache   . Osteoarthritis   . Premature atrial contractions   . Raynaud's phenomenon   . Symptomatic menopausal or female climacteric states     Objective: BP 104/64 (BP Location: Left Arm, Patient Position: Sitting, Cuff Size: Normal)   Pulse 75   Temp 98.1 F (36.7 C) (Oral)   Ht 5\' 2"  (1.575 m)   Wt 123 lb (55.8 kg)   SpO2 96%   BMI 22.50 kg/m  General: Awake, appears stated age Lungs: No accessory muscle use Psych: Age appropriate judgment and insight, normal affect and mood  Assessment and Plan: Diabetes mellitus type 2 in nonobese ([place]) - Plan: Increase Lantus to 15 units nightly. Monitor sugars. Try to send Elenor Legato in again. Novolog sent should she have spikes. Will do a conservative 50:1 sliding scale for every 50 above 350. She will let us know how much it drops her sugar with 1 unit. This will help determine ideal insulin dosing and sensitivity factor.   F/u in 1 week.  The patient voiced understanding and agreement to the plan.  [place], DO 03/11/19  1:36 PM

## 2019-03-12 NOTE — Telephone Encounter (Signed)
Notified pharmacist.

## 2019-03-12 NOTE — Telephone Encounter (Signed)
Max daily dose 25 units. Ty.

## 2019-03-13 ENCOUNTER — Encounter: Payer: Self-pay | Admitting: Family Medicine

## 2019-03-13 ENCOUNTER — Telehealth: Payer: Self-pay | Admitting: Family Medicine

## 2019-03-13 ENCOUNTER — Other Ambulatory Visit: Payer: Self-pay

## 2019-03-13 MED ORDER — INSULIN LISPRO 100 UNIT/ML ~~LOC~~ SOLN: SUBCUTANEOUS | 11 refills | Status: DC

## 2019-03-13 MED ORDER — INSULIN GLARGINE 100 UNIT/ML SOLOSTAR PEN: 15.0000 [IU] | PEN_INJECTOR | Freq: Every day | SUBCUTANEOUS | 11 refills | Status: DC

## 2019-03-13 NOTE — Telephone Encounter (Signed)
Copied from [place] [id number]. Topic: General - Other >> Mar 12, 2019 12:49 PM Percell Belt A wrote: Reason for CRM: pt called in and stated she would like a call back from the nurse about the insulin aspart (NOVOLOG) 100 UNIT/ML FlexPen [[id number]].  She would not provide me with any other info.  Best number is [phone]   Taken care of see Telephone note

## 2019-03-13 NOTE — Addendum Note (Signed)
Addended by: Jiles Prows on: 03/13/2019 10:25 AM   Modules accepted: Orders

## 2019-03-13 NOTE — Telephone Encounter (Signed)
Per [employer] will not pay for novolog but it will pay for humallog. Verbal order given to change medication and asked to please call patient as soon as is ready for pick up.   Patient advised rx was changed.

## 2019-03-18 ENCOUNTER — Encounter: Payer: Self-pay | Admitting: Family Medicine

## 2019-03-18 ENCOUNTER — Ambulatory Visit: Payer: PRIVATE HEALTH INSURANCE | Admitting: Family Medicine

## 2019-03-18 ENCOUNTER — Other Ambulatory Visit: Payer: Self-pay

## 2019-03-18 ENCOUNTER — Other Ambulatory Visit: Payer: Self-pay | Admitting: *Deleted

## 2019-03-18 ENCOUNTER — Telehealth: Payer: Self-pay | Admitting: Family Medicine

## 2019-03-18 VITALS — BP 106/72 | HR 84 | Temp 98.0°F | Ht 62.0 in | Wt 122.5 lb

## 2019-03-18 DIAGNOSIS — E119 Type 2 diabetes mellitus without complications: Secondary | ICD-10-CM

## 2019-03-18 MED ORDER — INSULIN ASPART PROT & ASPART (70-30 MIX) 100 UNIT/ML PEN: 9.0000 [IU] | PEN_INJECTOR | Freq: Two times a day (BID) | SUBCUTANEOUS | 11 refills | Status: DC

## 2019-03-18 MED ORDER — INSULIN LISPRO PROT & LISPRO (75-25 MIX) 100 UNIT/ML KWIKPEN: 9.0000 [IU] | PEN_INJECTOR | Freq: Two times a day (BID) | SUBCUTANEOUS | 11 refills | Status: DC

## 2019-03-18 NOTE — Telephone Encounter (Signed)
error 

## 2019-03-18 NOTE — Telephone Encounter (Signed)
Please advise 

## 2019-03-18 NOTE — Telephone Encounter (Signed)
Patient notified that replacement was sent in today.

## 2019-03-18 NOTE — Patient Instructions (Addendum)
Check sugars in morning before you eat and around 2 hours after meals. Bring your log to your next appointment.   Take first dose of 70/30 with your first meal around 10 AM. Second dose with last or second to last meal around 7-8 PM.  Use Humalog if sugars are over 350.   Keep the diet clean and stay active.  Let us know if you need anything.

## 2019-03-18 NOTE — Progress Notes (Signed)
Chief Complaint  Patient presents with  . Follow-up    Subjective: Patient is a 63 y.o. female here for DM f/u.  Changed Lantus from 10 u nightly to 15 u nightly. She did not use the Humalog as she was anxious about it. Diet is fair, due to her gastroparesis she is having to eat multiple meals. Sugars are low 200's in AM, 300-400's after meals in afternoon.    ROS: Endo: +hyperglycemia Const: No wt loss  Past Medical History:  Diagnosis Date  . Anxiety   . Colon polyp   . Gastroparesis   . GERD (gastroesophageal reflux disease)   . Hyperlipidemia   . Lupus ([place])   . Migraine headache   . Osteoarthritis   . Premature atrial contractions   . Raynaud's phenomenon   . Symptomatic menopausal or female climacteric states     Objective: BP 106/72 (BP Location: Left Arm, Patient Position: Sitting, Cuff Size: Normal)   Pulse 84   Temp 98 F (36.7 C) (Oral)   Ht 5\' 2"  (1.575 m)   Wt 122 lb 8 oz (55.6 kg)   SpO2 97%   BMI 22.41 kg/m  General: Awake, appears stated age Lungs: No accessory muscle use Psych: Age appropriate judgment and insight, normal affect and mood  Assessment and Plan: Diabetes mellitus type 2 in nonobese ([place]) - Plan: insulin aspart protamine - aspart (NOVOLOG 70/30 MIX) (70-30) 100 UNIT/ML FlexPen; I don't think BBI would be in her best interest at this time due to her grazing nature. Humalog if sugars > 350. I think this will be better, but if she is not better controlled next week, will refer to endo.  The patient voiced understanding and agreement to the plan.  Greater than 25 minutes were spent face to face with the patient with greater than 50% of this time spent counseling on sugars, insulin 70/30, monitoring sugars and prognosis.    [place], DO 03/18/19  3:14 PM

## 2019-03-18 NOTE — Telephone Encounter (Signed)
Patient notified that a replacement was sent in.

## 2019-03-18 NOTE — Telephone Encounter (Signed)
Humalog sent.

## 2019-03-18 NOTE — Telephone Encounter (Signed)
insulin aspart protamine - aspart (NOVOLOG 70/30 MIX) (70-30) 100 UNIT/ML FlexPen     Patient states insurance will not cover this and prefers humalog 70-30. Patient inquired if this could be changed and sent into pharmacy today, if possible.   Pharmacy:  [id number] - [place], [place] - [address] AT [address] [phone] (Phone) [phone] (Fax)

## 2019-03-25 ENCOUNTER — Encounter: Payer: Self-pay | Admitting: Family Medicine

## 2019-03-25 ENCOUNTER — Other Ambulatory Visit: Payer: Self-pay

## 2019-03-25 ENCOUNTER — Other Ambulatory Visit: Payer: Self-pay | Admitting: Family Medicine

## 2019-03-25 ENCOUNTER — Ambulatory Visit: Payer: PRIVATE HEALTH INSURANCE | Admitting: Family Medicine

## 2019-03-25 VITALS — BP 110/70 | HR 79 | Temp 98.2°F | Ht 62.0 in | Wt 125.2 lb

## 2019-03-25 DIAGNOSIS — Z23 Encounter for immunization: Secondary | ICD-10-CM | POA: Diagnosis not present

## 2019-03-25 DIAGNOSIS — E119 Type 2 diabetes mellitus without complications: Secondary | ICD-10-CM | POA: Diagnosis not present

## 2019-03-25 DIAGNOSIS — Z1239 Encounter for other screening for malignant neoplasm of breast: Secondary | ICD-10-CM

## 2019-03-25 LAB — MICROALBUMIN / CREATININE URINE RATIO
Creatinine,U: 60.5 mg/dL
Microalb Creat Ratio: 1.2 mg/g (ref 0.0–30.0)
Microalb, Ur: <0.7 mg/dL (ref 0.0–1.9)

## 2019-03-25 NOTE — Patient Instructions (Addendum)
If you do not hear anything about your referral in the next 1-2 weeks, call our office and ask for an update.  Call your GYN for an appt.  Call Center for [place] at [facility] at [phone] for an appointment.  They are located at [address], [place], [place], [address] (right across the hall from our office).  Let us know if you need anything.

## 2019-03-25 NOTE — Progress Notes (Signed)
Chief Complaint  Patient presents with  . Follow-up    Subjective: Patient is a 63 y.o. female here for DM f/u.  Changed from BBI to 75-25 bid. Sugars more stable. Has been having low sugars in AM. Due to gastroparesis, has been grazing as opposed to eating full meals. Taking 9 u bid. It was thought that she was having pancreatic failure to make insulin. Diet is fair. Urination and foggy headed sensation less freq now. Checks sugars bid, lower on average in AM, higher in PM. Compliant w meds.    ROS: Endo: As noted in HPI Neuro: +neuropathy  Past Medical History:  Diagnosis Date  . Anxiety   . Colon polyp   . Gastroparesis   . GERD (gastroesophageal reflux disease)   . Hyperlipidemia   . Lupus ([place])   . Migraine headache   . Osteoarthritis   . Premature atrial contractions   . Raynaud's phenomenon   . Symptomatic menopausal or female climacteric states     Objective: BP 110/70 (BP Location: Left Arm, Patient Position: Sitting, Cuff Size: Normal)   Pulse 79   Temp 98.2 F (36.8 C) (Oral)   Ht 5\' 2"  (1.575 m)   Wt 125 lb 4 oz (56.8 kg)   SpO2 98%   BMI 22.91 kg/m  General: Awake, appears stated age HEENT: MMM, EOMi Heart: RRR Lungs: No accessory muscle use Neuro: no semblance of pinprick on feet b/l Skin: No ext lesions Psych: Age appropriate judgment and insight, normal affect and mood  Assessment and Plan: Diabetes mellitus type 2 in nonobese [facility]) - Plan: Ambulatory referral to Endocrinology, Microalbumin / creatinine urine ratio, change insulin to 11 u in AM, 7 u in PM, hopefully her AM hypoglycemia will improve.   Screening for malignant neoplasm of breast - Plan: MM 3D SCREEN BREAST BILATERAL  Need for vaccination against Streptococcus pneumoniae - Plan: Pneumococcal polysaccharide vaccine 23-valent greater than or equal to 2yo subcutaneous/IM  F/u prn regarding this now.  The patient voiced understanding and agreement to the plan.  [place], DO 03/25/19  2:56 PM

## 2019-03-27 ENCOUNTER — Encounter: Payer: Self-pay | Admitting: Family Medicine

## 2019-04-02 ENCOUNTER — Other Ambulatory Visit: Payer: Self-pay

## 2019-04-02 ENCOUNTER — Encounter: Payer: PRIVATE HEALTH INSURANCE | Attending: Family Medicine | Admitting: Dietician

## 2019-04-02 ENCOUNTER — Encounter: Payer: Self-pay | Admitting: Dietician

## 2019-04-02 DIAGNOSIS — E119 Type 2 diabetes mellitus without complications: Secondary | ICD-10-CM | POA: Diagnosis present

## 2019-04-02 NOTE — Patient Instructions (Signed)
Continue to check your blood sugar 3-4 times daily See the handout on treating low blood sugar.  Be sure you are testing 15 minutes after treating and then having a snack with carbohydrates and protein when you blood sugar has returned to normal.    Continue to eat small meals and snacks (be sure you are eating carbohydrates and protein). Choose a small amount of protein with each meal and snack (1-2 ounces for example).  Make an appointment to see me in 1-2 months.  This can be scheduled for the same day as you see Dr. Myrle Sheng.  (I work in her office on Thursday and Friday.)

## 2019-04-06 ENCOUNTER — Encounter: Payer: Self-pay | Admitting: Internal Medicine

## 2019-04-06 ENCOUNTER — Ambulatory Visit: Payer: PRIVATE HEALTH INSURANCE | Admitting: Internal Medicine

## 2019-04-06 ENCOUNTER — Other Ambulatory Visit: Payer: Self-pay

## 2019-04-06 VITALS — BP 118/68 | HR 81 | Temp 98.7°F | Ht 62.0 in | Wt 127.4 lb

## 2019-04-06 DIAGNOSIS — IMO0001 Reserved for inherently not codable concepts without codable children: Secondary | ICD-10-CM

## 2019-04-06 DIAGNOSIS — E042 Nontoxic multinodular goiter: Secondary | ICD-10-CM | POA: Diagnosis not present

## 2019-04-06 DIAGNOSIS — E119 Type 2 diabetes mellitus without complications: Secondary | ICD-10-CM | POA: Diagnosis not present

## 2019-04-06 DIAGNOSIS — Z794 Long term (current) use of insulin: Secondary | ICD-10-CM | POA: Diagnosis not present

## 2019-04-06 NOTE — Patient Instructions (Addendum)
-   Continue Humalog Mix at 11 units with Breakfast and 5 units with Supper    - try and east supper 3-4 hours before bedtime    Choose healthy, lower carb lower calorie snacks: toss salad, cooked vegetables, cottage cheese, peanut butter, low fat cheese / string cheese, lower sodium deli meat, tuna salad or chicken salad     HOW TO TREAT LOW BLOOD SUGARS (Blood sugar LESS THAN 70 MG/DL)  Please follow the RULE OF 15 for the treatment of hypoglycemia treatment (when your (blood sugars are less than 70 mg/dL)    STEP 1: Take 15 grams of carbohydrates when your blood sugar is low, which includes:   3-4 GLUCOSE TABS  OR  3-4 OZ OF JUICE OR REGULAR SODA OR  ONE TUBE OF GLUCOSE GEL     STEP 2: RECHECK blood sugar in 15 MINUTES STEP 3: If your blood sugar is still low at the 15 minute recheck --> then, go back to STEP 1 and treat AGAIN with another 15 grams of carbohydrates.

## 2019-04-06 NOTE — Progress Notes (Signed)
Name: Taylor [name]  MRN/ DOB: [id number], 05-15-1956   Age/ Sex: 63 y.o., female    PCP: Taylor Pal, DO   Reason for Endocrinology Evaluation: Type 2 Diabetes Mellitus     Date of Initial Endocrinology Visit: 04/06/2019     PATIENT IDENTIFIER: Taylor [name] is a 63 y.o. female with a past medical history of T2DM, SLE, GERD, Gastroparesis. The patient presented for initial endocrinology clinic visit on 04/06/2019 for consultative assistance with her diabetes management.    HPI: Taylor [name] was    Diagnosed with DM 01/2019 had symptomatic hyperglycemia  Prior Medications tried/Intolerance:  n/a Currently checking blood sugars 3 x / day,  before meals   Hypoglycemia episodes : yes             Symptoms: shaky, hot and sweaty   Frequency: 4/ weeks Hemoglobin A1c is 11.1% (01/2019)  Patient required assistance for hypoglycemia: no Patient has required hospitalization within the last 1 year from hyper or hypoglycemia: no  In terms of diet, the patient eats 3 meals a day and snacks during the day      Thyroid Nodule History :  She was found to have an incidental MNG on CT scan. A dominant right thyroid nodule of 1.6x0.9x1 cm was performed with benign cytology in 05/2015 Due to concerns of weight loss and feeling poorly a cortisol was checked by her endocrinologist in 05/2018 at 9.8 mcg/dL    Taylor [name]: Humalog Mix (75/25) 11 units between 8-9 AM and 7 units with supper    Statin: yes ACE-I/ARB: no  Prior Diabetic Education: yes   METER DOWNLOAD SUMMARY: Date range evaluated: 8/4-8/17/2020 Fingerstick Blood Glucose Tests = 52 Average Number Tests/Day = 3.7 Overall Mean FS Glucose = 201   BG Ranges: Low = 53 High = 347   Hypoglycemic Events/30 Days: BG < 50 = 0 Episodes of symptomatic severe hypoglycemia = 0   DIABETIC COMPLICATIONS: Microvascular complications:    Denies: CKD, neuropathy, retinopathy   Last eye exam: Completed 01/2019   Macrovascular complications:    Denies: CAD, PVD, CVA   PAST HISTORY: Past Medical History:  Past Medical History:  Diagnosis Date  . Anxiety   . Colon polyp   . Gastroparesis   . GERD (gastroesophageal reflux disease)   . Hyperlipidemia   . Lupus ([place])   . Migraine headache   . Osteoarthritis   . Premature atrial contractions   . Raynaud's phenomenon   . Symptomatic menopausal or female climacteric states    Past Surgical History:  Past Surgical History:  Procedure Laterality Date  . CHOLECYSTECTOMY    . HEMORRHOID SURGERY    . HIP PINNING    . LAPAROSCOPY    . NASAL SINUS SURGERY    . TOTAL KNEE ARTHROPLASTY     left knee  . WRIST SURGERY     Left wrist      Social History:  reports that she has never smoked. She has never used smokeless tobacco. She reports that she does not drink alcohol or use drugs. Family History:  Family History  Problem Relation Age of Onset  . CVA Mother   . Alzheimer's disease Mother   . Heart disease Father   . Diabetes Father   . Hypertension Father   . Hyperlipidemia Father   . CVA Brother   . Cancer Maternal Aunt        Ovarian Cancer  . Cancer Maternal Uncle  Colon Cancer     HOME MEDICATIONS: Allergies as of 04/06/2019      Reactions   Lyrica [pregabalin]    Phenergan [promethazine]    Prochlorperazine       Medication List       Accurate as of April 06, 2019  2:15 PM. If you have any questions, ask your nurse or doctor.        aspirin 81 MG tablet Take 81 mg by mouth daily.   atorvastatin 10 MG tablet Commonly known as: LIPITOR Take 1 tab daily.   Belbuca 450 MCG Film Generic drug: Buprenorphine HCl Place 1 patch inside cheek 2 (two) times daily.   celecoxib 200 MG capsule Commonly known as: CELEBREX Take 200 mg by mouth daily.   cholecalciferol 25 MCG (1000 UT) tablet Commonly known as: VITAMIN D3 Take 1,000 Units by mouth daily.   clonazePAM 1 MG tablet Commonly known as: KLONOPIN  Take 1 mg by mouth 2 (two) times daily as needed for anxiety.   docusate sodium 100 MG capsule Commonly known as: COLACE Take 100 mg by mouth 2 (two) times daily.   Emgality 120 MG/ML Soaj Generic drug: Galcanezumab-gnlm Inject into the skin.   estradiol 0.1 MG/24HR patch Commonly known as: [place] Place 1 patch (0.1 mg total) onto the skin 2 (two) times a week.   famotidine 40 MG tablet Commonly known as: PEPCID Take 40 mg by mouth daily.   FLUoxetine 40 MG capsule Commonly known as: PROZAC Take 40 mg by mouth daily.   FreeStyle Libre 14 Day Reader Taylor [name] 1 Device by Does not apply route every 14 (fourteen) days.   FreeStyle Taylor [name] Use to check sugars. Replace after 14 days.   insulin lispro 100 UNIT/ML injection Commonly known as: HumaLOG Use your sliding scale in inject insulin up to 25 units   Insulin Lispro Prot & Lispro (75-25) 100 UNIT/ML Kwikpen Commonly known as: HUMALOG 75/25 MIX Take 11 units in the morning and 7 units in the evening. Max of 30 units twice daily.   Lancets Misc Use daily to check blood sugar   loratadine 10 MG tablet Commonly known as: CLARITIN Take 10 mg by mouth daily.   Melatonin 1 MG Tabs Take by mouth.   metoprolol succinate 25 MG 24 hr tablet Commonly known as: TOPROL-XL Take 1 tablet (25 mg total) by mouth daily.   multivitamin with minerals Tabs tablet Take 1 tablet by mouth daily.   NovoFine 32G X 6 MM Misc Generic drug: Insulin Pen Needle Use with insulin pen   omeprazole 20 MG capsule Commonly known as: PRILOSEC Take 1 capsule (20 mg total) by mouth 2 (two) times daily. What changed: how much to take   ONE TOUCH ULTRA 2 w/Device Kit Use once daily to check blood sugar..   DX E11.9   OneTouch Ultra test strip Generic drug: glucose blood Use once daily to check blood sugar three times daily.  DXE11.9   oxycodone 5 MG capsule Commonly known as: OXY-IR Take 5 mg by mouth every 4 (four) hours  as needed.   progesterone 100 MG capsule Commonly known as: PROMETRIUM Take 1 capsule (100 mg total) by mouth daily.   SUMAtriptan 20 MG/ACT nasal spray Commonly known as: IMITREX Place 1 spray into the nose every 2 (two) hours as needed for migraine.        ALLERGIES: Allergies  Allergen Reactions  . Lyrica [Pregabalin]   . Phenergan [Promethazine]   . Prochlorperazine  REVIEW OF SYSTEMS: A comprehensive ROS was conducted with the patient and is negative except as per HPI and below:  Review of Systems  Gastrointestinal: Positive for nausea. Negative for diarrhea.  Genitourinary: Positive for frequency.  Musculoskeletal: Positive for joint pain.  Neurological: Positive for tingling and focal weakness. Negative for tremors.       Secondary to back issues   Endo/Heme/Allergies: Positive for polydipsia.      OBJECTIVE:   VITAL SIGNS: BP 118/68 (BP Location: Left Arm, Patient Position: Sitting, Cuff Size: Normal)   Pulse 81   Temp 98.7 F (37.1 C)   Ht '5\' 2"'  (1.575 m)   Wt 127 lb 6.4 oz (57.8 kg)   SpO2 99%   BMI 23.30 kg/m    PHYSICAL EXAM:  General: Pt appears well and is in NAD  Hydration: Well-hydrated with moist mucous membranes and good skin turgor  HEENT: Head: Unremarkable with good dentition. Oropharynx clear without exudate.  Eyes: External eye exam normal without stare, lid lag or exophthalmos.  EOM intact.   Neck: General: Supple without adenopathy or carotid bruits. Thyroid: Thyroid size normal.  No goiter or nodules appreciated. No thyroid bruit.  Lungs: Clear with good BS bilat with no rales, rhonchi, or wheezes  Heart: RRR with normal S1 and S2 and no gallops; no murmurs; no rub  Abdomen: Normoactive bowel sounds, soft, nontender, without masses or organomegaly palpable  Extremities:  Lower extremities - No pretibial edema. No lesions.  Skin: Normal texture and temperature to palpation. No rash noted. No Acanthosis nigricans/skin tags. No  lipohypertrophy.  Neuro: MS is good with appropriate affect, pt is alert and Ox3    DM foot exam:   The skin of the feet is intact without sores or ulcerations. The pedal pulses are 2+ on right and 2+ on left. The sensation is decreased to a screening 5.07, 10 gram monofilament bilaterally - this is attributed to her spinal sx  DATA REVIEWED:  Lab Results  Component Value Date   HGBA1C 11.1 (H) 02/11/2019   Lab Results  Component Value Date   MICROALBUR <0.7 03/25/2019   LDLCALC 84 02/11/2019   CREATININE 0.86 02/11/2019   Lab Results  Component Value Date   MICRALBCREAT 1.2 03/25/2019    Lab Results  Component Value Date   CHOL 194 02/11/2019   HDL 75.40 02/11/2019   LDLCALC 84 02/11/2019   TRIG 175.0 (H) 02/11/2019   CHOLHDL 3 02/11/2019        ASSESSMENT / PLAN / RECOMMENDATIONS:   1) Newly Diagnosed Diabetes Mellitus,  Without complications - Most recent A1c of 11.1 %. Goal A1c < 7.0 %.     - Phenotypically she doesn't the criteria for T2DM, I will check autoantibodies, as she is a high risk for immune diabetes given her hx of SLE - She is having recurrent hypoglycemia episodes over night, she takes her late dose too close to her bedtime, she was advised to eat supper 5-6 pm and take it then (now she takes it around 8 pm) goes to bed around 9 pm. She has been waking up at 4 am with sweating around her neck, I suspect this is hypoglycemia mediated.  - She was also noted to over-correct- that was dicussed today  - She has gastroparesis and is eats up to 6x a day, which may make it difficult to reach glucose optimal goals, but the insulin mix is the best option given her eating pattern at this time.  MEDICATIONS:  Continue Humalog Mix at 11 units with Breakfast and 5 units with Supper     EDUCATION / INSTRUCTIONS:  BG monitoring instructions: Patient is instructed to check her blood sugars 2 times a day, before breakfast and supper.  Call [facility]  cl[facility] if: BG persistently < 70 or > 300. . I reviewed the Rule of 15 for the treatment of hypoglycemia in detail with the patient. Literature supplied.   2) Diabetic complications:   Eye: Does not have known diabetic retinopathy.   Neuro/ Feet: Does  have known peripheral neuropathy that is related to spinal sx  Renal: Patient does not have known baseline CKD. She is not on an ACEI/ARB at present.Check urine albumin/creatinine ratio yearly starting at time of diagnosis. If albuminuria is positive, treatment is geared toward better glucose, blood pressure control and use of ACE inhibitors or ARBs. Monitor electrolytes and creatinine once to twice yearly.   3) Lipids: Patient is on a Lipitor 10 mg daily. LDL acceptable at this time. Discussed cardiovascular benefits of statins .Marland Kitchen   4) Multinodular Goiter:   - No local symptoms - She is clinically euthyroid  - Her last U/S was in 12/2017, will repeat.  - S/P benign cytology of a right inferior nodule in 05/2015, per "care everywhere"  F/u in 6 weeks     Signed electronically by: Mack Guise, MD  [facility] Endocrinology  [facility] [address] [place], [place] [address] Phone: [phone] FAX: [phone]   CC: Taylor [name], [address] [place] [address] Phone: [phone]  Fax: [phone]    Return to Endocrinology clinic as below: Future Appointments  Date Time Provider [place]  04/14/2019  1:15 PM Taylor Pal, DO [facility] [place]

## 2019-04-07 LAB — BASIC METABOLIC PANEL
BUN: 20 mg/dL (ref 6–23)
CO2: 33 mEq/L — ABNORMAL HIGH (ref 19–32)
Calcium: 9.5 mg/dL (ref 8.4–10.5)
Chloride: 96 mEq/L (ref 96–112)
Creatinine, Ser: 0.7 mg/dL (ref 0.40–1.20)
GFR: 84.82 mL/min (ref >60.00)
Glucose, Bld: 196 mg/dL — ABNORMAL HIGH (ref 70–99)
Potassium: 4.7 mEq/L (ref 3.5–5.1)
Sodium: 135 mEq/L (ref 135–145)

## 2019-04-07 LAB — TSH: TSH: 0.83 u[IU]/mL (ref 0.35–4.50)

## 2019-04-07 LAB — T4, FREE: Free T4: 0.75 ng/dL (ref 0.60–1.60)

## 2019-04-07 MED ORDER — INSULIN LISPRO PROT & LISPRO (75-25 MIX) 100 UNIT/ML KWIKPEN: PEN_INJECTOR | SUBCUTANEOUS | 11 refills | Status: DC

## 2019-04-07 NOTE — Progress Notes (Signed)
Medical Nutrition Therapy:  Appt start time: 1125 end time:  1200.  Assessment:  Primary concerns today: This is a follow up appointment with diabetes.  Patient is here today alone.  (Since this appointment, she has met with her endocrinologist and Glutamic Acid Decarb was >250 indicating Type 1 diabetes caused by autoimmune).    During this visit patient states that she is "hungry all of the time", constantly nauseous, and complains of headaches.  She has occasional hypoglycemia (BG 53 with symptoms).   Previous RD visit 7/7.  Started on insulin 7/8.   Medications include Humalog 75/25 11 units each am (9-10 am) and 7 units (7pm).  Endo decreased to 5 units Humalog 75/25 and instructed her to take this with an earlier dinner about 5 pm to prevent early am low BG.  She also takes Short acting insulin if her BG is >350 based on a sliding scale. She uses a One Touch Meter Weight increased from 122 lbs 7/7 to 126.5 lbs at this visit.  Today she is concerned about gaining too much weight.  History includes gastroparesis, lupus ("cause of gastroparesis"), constipation (once every 2 weeks), Newly diagnosed diabetes 01/2019 with A1C of 11.1%.    Weight hx:   122 lbs today 2016 lost from 148 lbs to 102 lbs due to increased gastroparesis and stress 104-110 lbs 2 years ago. She states she then gained about 3 lbs per month to 130 lbs 06/2018.  Patient lives with her husband (who works in [place] 4 days per week as a Garment/textile technologist), 72 yo daughter and 62 yo female who has been with them for the past 10 years.  She is a retired PT (2008).    DIETARY INTAKE:  24-hr recall:  B (4 am): coffee frapicino      (6 am):  [place] yogurt Snk (9 am) :Strawberries and [employer] with vegan butter  L (11:30/12am): 2 hard boiled eggs, 1-2 cubes cheese  Snk (2-3 PM): Glucerna Snack bar, pumpkin seeds Snk (4 PM):  [place] yogurt smoothie (premade), 2 slices [place] or ham occasional sugar free popsicle or cucumbers D (7  PM): Chinese [place] soup and small fried rice OR Bun, veges, 1 slice ham  Snk ( PM): none Beverages: water, diet soda, bottled Starbucks Mocha lite frapechine, diet tea  Recent physical activity: not exercising currently.  Walks with a cane.  Feels too unstable to exercise alone.  Daughter is going back to college this month.  [place] and it is unsafe for her to walk.  Estimated energy needs: 1500 calories 188 g carbohydrates 75 g protein 50 g fat  Progress Towards Goal(s):  In progress.   Nutritional Diagnosis:  NB-1.1 Food and nutrition-related knowledge deficit As related to balance of carbohydrates, protein, and fat.  As evidenced by diet hx and patient report.    Intervention:  Nutrition education continued.  Reviewed treatment for low BG.  Discussed balanced carb and protein with meals and snacks.    Reviewed BG and concerns of BG control.  Reviewed insulin injection.  Continue to check your blood sugar 3-4 times daily See the handout on treating low blood sugar.  Be sure you are testing 15 minutes after treating and then having a snack with carbohydrates and protein when you blood sugar has returned to normal.    Continue to eat small meals and snacks (be sure you are eating carbohydrates and protein). Choose a small amount of protein with each meal and snack (  1-2 ounces for example).  Make an appointment to see me in 1-2 months.  This can be scheduled for the same day as you see Dr. Myrle Sheng.  (I work in her office on Thursday and Friday.)   Handouts given during visit include:  Meal plan card  Monitoring/Evaluation:  Dietary intake, exercise, label reading, and body weight in 2 month(s).

## 2019-04-08 LAB — GLUTAMIC ACID DECARBOXYLASE AUTO ABS: Glutamic Acid Decarb Ab: >250 IU/mL — ABNORMAL HIGH (ref <5)

## 2019-04-09 ENCOUNTER — Telehealth: Payer: Self-pay | Admitting: Internal Medicine

## 2019-04-09 MED ORDER — INSULIN LISPRO PROT & LISPRO (75-25 MIX) 100 UNIT/ML KWIKPEN: PEN_INJECTOR | SUBCUTANEOUS | 11 refills | Status: DC

## 2019-04-09 NOTE — Telephone Encounter (Signed)
Discussed lab results with the patient she has Latent Autoimmune Diabetes of Adults (LADA)    Results for Taylor [name], Taylor [name] (MRN [id number]) as of 04/09/2019 12:08  Ref. Range 04/06/2019 14:43  Glutamic Acid Decarb Ab Latest Ref Range: <5 IU/mL >250 (H)    Pt will need to be treated as a T1DM  And will require insulin for life.   Yesterday afternoon while at the store, she felt shaky and jittery , did not have the meter. Took glucose tablets.    Recommendations Decrease Novolog morning dose to 10 units   Pt expressed understanding    Abby Nena Jordan, MD  [facility] Endocrinology  [facility] [address] [place], [place] [address] Phone: [phone] FAX: [phone]

## 2019-04-13 ENCOUNTER — Other Ambulatory Visit: Payer: Self-pay | Admitting: Internal Medicine

## 2019-04-13 ENCOUNTER — Other Ambulatory Visit: Payer: Self-pay

## 2019-04-13 LAB — ISLET CELL AB/R TITER: Islet Cell Ab Titer: 1280 JDF units — ABNORMAL HIGH

## 2019-04-13 LAB — ISLET CELL AB SCREEN RFLX TO TITER: ISLET CELL ANTIBODY SCREEN: POSITIVE — AB

## 2019-04-13 MED ORDER — NOVOLOG MIX 70/30 FLEXPEN (70-30) 100 UNIT/ML ~~LOC~~ SUPN: PEN_INJECTOR | SUBCUTANEOUS | 11 refills | Status: DC

## 2019-04-14 ENCOUNTER — Ambulatory Visit: Payer: PRIVATE HEALTH INSURANCE | Admitting: Family Medicine

## 2019-04-14 ENCOUNTER — Encounter: Payer: Self-pay | Admitting: Family Medicine

## 2019-04-14 VITALS — BP 120/82 | HR 91 | Temp 95.9°F | Ht 62.0 in | Wt 127.1 lb

## 2019-04-14 DIAGNOSIS — E1065 Type 1 diabetes mellitus with hyperglycemia: Secondary | ICD-10-CM | POA: Diagnosis not present

## 2019-04-14 NOTE — Patient Instructions (Addendum)
Schedule an appt with Dr. Cruzita Lederer.  Try to eat scheduled meals throughout the day.   Send me your sugar readings in 1 week.  Let us know if you need anything.

## 2019-04-14 NOTE — Progress Notes (Signed)
Chief Complaint  Patient presents with  . Follow-up    Blood sugar    Subjective: Patient is a 63 y.o. female here for f/u sugars.  Patient had been referred to endocrinology for further management of her diabetes.  She does have islet cell antibodies indicating a type 1 diabetes.  She is currently on Humalog 75/25 10 units in the morning and 5 units in the evening.  She had been experiencing hypoglycemia in the afternoon.  She has decreased to 10 u since that time and has had hyperglycemia instead.  She has a history of gastroparesis so her meals are spread out and sporadic.  This is may control your sugars quite difficult.  Overall, her average sugar levels have been decreasing.  Currently, she starts having hypoglycemia at a sugar level of 120. It used to be in the high 100s when she would start having symptoms..   ROS: Endo: +hypoglycemia  Past Medical History:  Diagnosis Date  . Anxiety   . Colon polyp   . Gastroparesis   . GERD (gastroesophageal reflux disease)   . Hyperlipidemia   . Lupus ([place])   . Migraine headache   . Osteoarthritis   . Premature atrial contractions   . Raynaud's phenomenon   . Symptomatic menopausal or female climacteric states     Objective: BP 120/82 (BP Location: Left Arm, Patient Position: Sitting, Cuff Size: Normal)   Pulse 91   Temp (!) 95.9 F (35.5 C) (Temporal)   Ht 5\' 2"  (1.575 m)   Wt 127 lb 2 oz (57.7 kg)   SpO2 96%   BMI 23.25 kg/m  General: Awake, appears stated age Lungs: No accessory muscle use Psych: Age appropriate judgment and insight, normal affect and mood  Assessment and Plan: Uncontrolled type 1 diabetes mellitus with hyperglycemia ([place])  Try to eat more scheduled meals. Cont insulin at current dosing. Send me sugar readings in 1 week and we will see if we need to make a change. She is considering seeing another endocrinologist. I will see her in 2 weeks.  The patient voiced understanding and agreement to the  plan.  [place], DO 04/14/19  2:42 PM

## 2019-04-28 ENCOUNTER — Other Ambulatory Visit: Payer: Self-pay

## 2019-04-28 ENCOUNTER — Ambulatory Visit: Payer: PRIVATE HEALTH INSURANCE | Admitting: Family Medicine

## 2019-04-28 ENCOUNTER — Encounter: Payer: Self-pay | Admitting: Family Medicine

## 2019-04-28 VITALS — BP 110/70 | HR 102 | Temp 98.0°F | Ht 62.0 in | Wt 127.2 lb

## 2019-04-28 DIAGNOSIS — E1065 Type 1 diabetes mellitus with hyperglycemia: Secondary | ICD-10-CM | POA: Diagnosis not present

## 2019-04-28 NOTE — Patient Instructions (Addendum)
Next Wednesday, 05/06/19, send me a message with your sugar readings.  For every 40 points above a glucose level of 300, take a unit. Ie: a sugar reading of 341 is 2 units. A sugar reading of 400 would be 3 units. Let us know if you have questions with this. Do not do this more frequently than ever 4 hours.  Check your sugars in the morning before you eat. Also check before meals intermittently and after. Check 2 hours after taking the Humalog (short acting insulin) as well.   OK to supplement/replace meals with a protein shake/bar.   Let us know if you need anything.

## 2019-04-28 NOTE — Progress Notes (Signed)
Chief Complaint  Patient presents with  . Follow-up    Subjective: Patient is a 63 y.o. female here for DM f/u.  Sugars have been running in 200-300's during day, 80-160's at night. She is not hungry during day, very hungry at night. Gastroparesis limits her appetite. Taking 75-25 insulin, 10 u in AM, 5 u in PM. Feels hypoglycemic <130, this has been steadily lowered throughout her tx.    ROS: Endo: +hypoglycemia  Past Medical History:  Diagnosis Date  . Anxiety   . Colon polyp   . Gastroparesis   . GERD (gastroesophageal reflux disease)   . Hyperlipidemia   . Lupus ([place])   . Migraine headache   . Osteoarthritis   . Premature atrial contractions   . Raynaud's phenomenon   . Symptomatic menopausal or female climacteric states     Objective: BP 110/70 (BP Location: Left Arm, Patient Position: Sitting, Cuff Size: Normal)   Pulse (!) 102   Temp 98 F (36.7 C) (Oral)   Ht 5\' 2"  (1.575 m)   Wt 127 lb 4 oz (57.7 kg)   SpO2 97%   BMI 23.27 kg/m  General: Awake, appears stated age Lungs: No accessory muscle use Psych: Age appropriate judgment and insight, normal affect and mood  Assessment and Plan: Uncontrolled type 1 diabetes mellitus with hyperglycemia ([facility])  Change to 11 u in AM, 4 u in PM. Send me message in 1 week with readings. 300+, 1 u for ever 40 of Humalog. Ck sugars at home. F/u in 2 weeks. Is looking into following up with endo also.  The patient voiced understanding and agreement to the plan.  [place], DO 04/28/19  2:24 PM

## 2019-05-11 ENCOUNTER — Other Ambulatory Visit: Payer: Self-pay

## 2019-05-12 ENCOUNTER — Ambulatory Visit: Payer: PRIVATE HEALTH INSURANCE | Admitting: Family Medicine

## 2019-05-12 ENCOUNTER — Encounter: Payer: Self-pay | Admitting: Family Medicine

## 2019-05-12 VITALS — BP 108/64 | HR 73 | Temp 96.9°F | Ht 62.0 in | Wt 131.5 lb

## 2019-05-12 DIAGNOSIS — E1065 Type 1 diabetes mellitus with hyperglycemia: Secondary | ICD-10-CM | POA: Diagnosis not present

## 2019-05-12 DIAGNOSIS — Z1239 Encounter for other screening for malignant neoplasm of breast: Secondary | ICD-10-CM | POA: Diagnosis not present

## 2019-05-12 DIAGNOSIS — Z23 Encounter for immunization: Secondary | ICD-10-CM

## 2019-05-12 NOTE — Patient Instructions (Addendum)
Consider taking a Glucerna or complex carb with your morning sugar.   Continue on the insulin at 11 and 4.  Keep the Humalog at the same scale.  If you eat a heavier meal, consider taking 1-2 units of the Humalog.  Send me a MyChart message the day after your birthday with your sugar readings.   Let us know if you need anything.

## 2019-05-12 NOTE — Progress Notes (Signed)
Chief Complaint  Patient presents with  . Follow-up    2 week    Subjective: Patient is a 63 y.o. female here for DM f/u.  11 u of 75/25 in AM, 4 u in PM. 1 u Humalog for every 40 unit of glucose above 300. Did have a few low readings. Her eating is erratic 2/2 gastroparesis. Compliant with medications. Sugars range in mid-high 100's and low 200's. Her threshold for hypoglycemia is markedly improved. She gets hungry just prior to going into hypoglycemia.   ROS: GI: +chronic issues Endo: No current hypoglycemia  Past Medical History:  Diagnosis Date  . Anxiety   . Colon polyp   . Gastroparesis   . GERD (gastroesophageal reflux disease)   . Hyperlipidemia   . Lupus ([place])   . Migraine headache   . Osteoarthritis   . Premature atrial contractions   . Raynaud's phenomenon   . Symptomatic menopausal or female climacteric states     Objective: BP 108/64 (BP Location: Left Arm, Patient Position: Sitting, Cuff Size: Normal)   Pulse 73   Temp (!) 96.9 F (36.1 C) (Temporal)   Ht 5\' 2"  (1.575 m)   Wt 131 lb 8 oz (59.6 kg)   SpO2 97%   BMI 24.05 kg/m  General: Awake, appears stated age Lungs: No accessory muscle use Psych: Age appropriate judgment and insight, normal affect and mood  Assessment and Plan: Uncontrolled type 1 diabetes mellitus with hyperglycemia ([place])  Need for influenza vaccination - Plan: Flu Vaccine QUAD 6+ mos PF IM (Fluarix Quad PF)  Screening for breast cancer - Plan: MM DIGITAL SCREENING BILATERAL  Cont 11 u in AM and 4 u in PM. Consider taking a complex carb when taking 11 u. Has appt w endo on 10/6. Will defer a1c and further management to them as her sugars are much better.  The patient voiced understanding and agreement to the plan.  Greater than 25 minutes were spent face to face with the patient with greater than 50% of this time spent counseling on diabetes, insulin, and management of hypoglycemia.   [place], DO 05/12/19  1:50  PM

## 2019-05-21 ENCOUNTER — Ambulatory Visit (HOSPITAL_BASED_OUTPATIENT_CLINIC_OR_DEPARTMENT_OTHER): Payer: PRIVATE HEALTH INSURANCE

## 2019-05-22 ENCOUNTER — Other Ambulatory Visit: Payer: Self-pay

## 2019-05-22 ENCOUNTER — Encounter: Payer: PRIVATE HEALTH INSURANCE | Attending: Family Medicine | Admitting: Dietician

## 2019-05-22 ENCOUNTER — Ambulatory Visit (INDEPENDENT_AMBULATORY_CARE_PROVIDER_SITE_OTHER): Payer: PRIVATE HEALTH INSURANCE | Admitting: Internal Medicine

## 2019-05-22 VITALS — BP 118/64 | HR 74 | Temp 98.3°F | Ht 62.0 in | Wt 131.4 lb

## 2019-05-22 DIAGNOSIS — E119 Type 2 diabetes mellitus without complications: Secondary | ICD-10-CM | POA: Insufficient documentation

## 2019-05-22 DIAGNOSIS — E139 Other specified diabetes mellitus without complications: Secondary | ICD-10-CM

## 2019-05-22 LAB — POCT GLYCOSYLATED HEMOGLOBIN (HGB A1C): Hemoglobin A1C: 7.4 % — AB (ref 4.0–5.6)

## 2019-05-22 MED ORDER — ONETOUCH ULTRA VI STRP: ORAL_STRIP | 11 refills | Status: DC

## 2019-05-22 MED ORDER — LANTUS SOLOSTAR 100 UNIT/ML ~~LOC~~ SOPN: 8.0000 [IU] | PEN_INJECTOR | Freq: Every day | SUBCUTANEOUS | 3 refills | Status: DC

## 2019-05-22 MED ORDER — INSULIN LISPRO (1 UNIT DIAL) 100 UNIT/ML (KWIKPEN): 3.0000 [IU] | PEN_INJECTOR | Freq: Three times a day (TID) | SUBCUTANEOUS | 11 refills | Status: DC

## 2019-05-22 NOTE — Patient Instructions (Addendum)
Treating a low Blood Sugar  Take 15 grams of fast acting carbohydrates and recheck in 15 minutes.  If still <70 treat with 15 more grams of fast acting carbohydrates.  Recheck in 15 minutes and after it has returned to normal eat a carb and protein  Take 30 grams of fast carbs if your blood sugar is 40 or less.  If still <70 treat with 15 more grams of fast acting carbohydrates.  Recheck in 15 minutes and after it has returned to normal eat a carb and protein.  Continue small frequent meals including carbohydrates and protein source. (egg and fruit OR cheese and crackers OR ham and roll OR yogurt OR Boost Glucose Control for example).  Continue to check BG as directed. Continue to take medication as directed.

## 2019-05-22 NOTE — Progress Notes (Signed)
Medical Nutrition Therapy:  Appt start time: 1125 end time:  1200.  Assessment:  Primary concerns today: This is a follow up appointment with diabetes.  Patient is here today alone.  She states that she is unhappy that she has gained 10 lbs since July and notices increased joint problems.  She states that she has had problems eating the amount of carbohydrates I recommended and since she is eating less, she is now reporting increased hypoglycemia again.  She has had 2-3 low BG 40-50 over the past week.  She often overcorrects these.  She states that she often experience cramping all over her body after treating the low BG. She states that she saw Dr. Adriana Simas (GI) last week who stated that she has changed from gastroparesis to dumping but maintains constipation.  She reports not having a bowel movement for a week at times but usually 3-4 times per week.  She states that constipation is "better when BG is improved".   She reports poor appetite and no desire for food.  When asked if she was depressed she states that opening that up causes a lot more questions in epic.  She reports stress and is alone most of the week with her husband away for work and does feel isolated. She states that she would drink nutrition shakes all day long except that she has been told by myself to get nourishment from food as able.  Medications that she reports today include:  Humalog 75/25 11 units q am and 4 units each eveneing.  She also takes Humalog (rapid acting only) - 1 unit per each 40 units above 300. Humalog Mix stopped with MD appointment 10.2 and changed to Lantus 8 units daily and Humalog 3 units 3 times daily with meals.  She is to see RN, CDE to discuss pumps and CGM.  History includes gastroparesis, lupus ("cause of gastroparesis"), constipation (once every 2 weeks), Newly diagnosed diabetes 01/2019 with A1C of 11.1%.  Glutamic Acid Decarb >250 indicating LADA.  Weight hx:  131 lbs today  126 lbs today  04/02/2019 2016 lost from 148 lbs to 102 lbs due to increased gastroparesis and stress 104-110 lbs 2 years ago. She states she then gained about 3 lbs per month to 130 or 140  lbs 06/2018.  Patient lives with her husband (who works in [place] 4 days per week as a Insurance account manager), 63 yo daughter and 62 yo female who has been with them for the past 10 years.  She is a retired PT (2008).    DIETARY INTAKE:  24-hr recall:  B (4 am): coffee frapicino      (6 am):  Boost Glucose Control Snk (9 am) :Chobani greek yogurt smoothie (premade) L (11:30/12am): 2 hard boiled eggs, 1-2 cubes cheese  Snk (2-3 PM): Glucerna Snack bar Snk (4 PM):  [place] yogurt smoothie (premade), 2 slices [place] or ham occasional sugar free popsicle or cucumbers D (7 PM): Chinese [place] soup and small fried rice OR Bun, veges, 1 slice ham  Snk ( PM): none Beverages: water, diet soda, bottled Starbucks Mocha lite frapechine, diet tea  Recent physical activity: not exercising currently.  Walks with a cane.  Feels too unstable to exercise alone.  Daughter is going back to college this month.  [place] and it is unsafe for her to walk.  Estimated energy needs: 1500 calories 188 g carbohydrates 75 g protein 50 g fat  Progress Towards Goal(s):  In progress.  Nutritional Diagnosis:  NB-1.1 Food and nutrition-related knowledge deficit As related to balance of carbohydrates, protein, and fat.  As evidenced by diet hx and patient report.    Intervention:  Nutrition education continued.  Again reviewed treatment for low BG and to avoid overcorrection.  Discussed need for balanced of small meals and to include carbohydrates and protein each time that she eats.  Discussed stress and implications on BG.  Discussed GI issues with need for continued small meals.  Treating a low Blood Sugar  Take 15 grams of fast acting carbohydrates and recheck in 15 minutes.  If still <70 treat with 15 more grams of fast acting  carbohydrates.  Recheck in 15 minutes and after it has returned to normal eat a carb and protein  Take 30 grams of fast carbs if your blood sugar is 40 or less.  If still <70 treat with 15 more grams of fast acting carbohydrates.  Recheck in 15 minutes and after it has returned to normal eat a carb and protein.  Continue small frequent meals including carbohydrates and protein source. (egg and fruit OR cheese and crackers OR ham and roll OR yogurt OR Boost Glucose Control for example).  Continue to check BG as directed. Continue to take medication as directed.  Monitoring/Evaluation:  Dietary intake, exercise, label reading, and body weight in 2 month(s).

## 2019-05-22 NOTE — Patient Instructions (Addendum)
-   Stop Humalog Mix  - Lantus 8 units daily  - Humalog 3 units  Three times a day with eating  - If sugar is over 200 before you eat, add 2 more units of Humalog to that meal   - Check your sugar 3 times a day      HOW TO TREAT LOW BLOOD SUGARS (Blood sugar LESS THAN 70 MG/DL)  Please follow the RULE OF 15 for the treatment of hypoglycemia treatment (when your (blood sugars are less than 70 mg/dL)    STEP 1: Take 15 grams of carbohydrates when your blood sugar is low, which includes:   3-4 GLUCOSE TABS  OR  3-4 OZ OF JUICE OR REGULAR SODA OR  ONE TUBE OF GLUCOSE GEL     STEP 2: RECHECK blood sugar in 15 MINUTES STEP 3: If your blood sugar is still low at the 15 minute recheck --> then, go back to STEP 1 and treat AGAIN with another 15 grams of carbohydrates.

## 2019-05-22 NOTE — Progress Notes (Signed)
Name: Taylor [name]  Age/ Sex: 63 y.o., female   MRN/ DOB: [id number], 1956/05/15     PCP: Shelda Pal, DO   Reason for Endocrinology Evaluation: Type 1 Diabetes Mellitus  Initial Endocrine Consultative Visit: 04/06/19    PATIENT IDENTIFIER: Taylor [name] is a 63 y.o. female with a past medical history of T2DM, SLE, GERD, Gastroparesis. The patient has followed with Endocrinology clinic since 04/06/2019 for consultative assistance with management of her diabetes.  DIABETIC HISTORY:  Ms. Laye was diagnosed with T1DM in 01/2019. Her hemoglobin A1c was 11.1% on diagnosis. No DKA   Thyroid Nodule History :  She was found to have an incidental MNG on CT scan. A dominant right thyroid nodule of 1.6x0.9x1 cm was performed with benign cytology in 05/2015 Due to concerns of weight loss and feeling poorly a cortisol was checked by her endocrinologist in 05/2018 at 9.8 mcg/dL   SUBJECTIVE:   During the last visit (04/06/2019): Continued insulin mix with reduction in night time insulin dose.   Today (05/23/2019): Taylor [name] is here for a follow up on her diabetes management. She checks her blood sugars 4 times daily. The patient has  had hypoglycemic episodes since the last clinic visit, which typically occur 3 x / week- most often occuring during the day. The patient is symptomatic with these episodes, with symptoms of shaking .  Otherwise, the patient has not required any recent emergency interventions for hypoglycemia and has not had recent hospitalizations secondary to hyper or hypoglycemic episodes.    ROS: As per HPI and as detailed below: Review of Systems  Constitutional: Negative for chills and fever.  HENT: Negative for congestion and sore throat.   Respiratory: Negative for cough and shortness of breath.   Cardiovascular: Negative for chest pain and palpitations.  Gastrointestinal: Positive for abdominal pain and nausea.      HOME DIABETES REGIMEN:  Novolog  70/30 10 units QAM and 5 units with supper- pt takes 11 in the morning and 4 units with Supper   Apparently she is also on another prandial insulin that she uses for correction.     METER DOWNLOAD SUMMARY: Date range evaluated: 9/19-10/09/2018 Fingerstick Blood Glucose Tests = 60 Average Number Tests/Day = 4.3 Overall Mean FS Glucose = 180   BG Ranges: Low = 40 High = 408   Hypoglycemic Events/30 Days: BG < 50 = 1 Episodes of symptomatic severe hypoglycemia = 1     HISTORY:  Past Medical History:  Past Medical History:  Diagnosis Date  . Anxiety   . Colon polyp   . Gastroparesis   . GERD (gastroesophageal reflux disease)   . Hyperlipidemia   . Lupus ([place])   . Migraine headache   . Osteoarthritis   . Premature atrial contractions   . Raynaud's phenomenon   . Symptomatic menopausal or female climacteric states    Past Surgical History:  Past Surgical History:  Procedure Laterality Date  . CHOLECYSTECTOMY    . HEMORRHOID SURGERY    . HIP PINNING    . LAPAROSCOPY    . NASAL SINUS SURGERY    . TOTAL KNEE ARTHROPLASTY     left knee  . WRIST SURGERY     Left wrist    Social History:  reports that she has never smoked. She has never used smokeless tobacco. She reports that she does not drink alcohol or use drugs. Family History:  Family History  Problem Relation Age of Onset  .  CVA Mother   . Alzheimer's disease Mother   . Heart disease Father   . Diabetes Father   . Hypertension Father   . Hyperlipidemia Father   . CVA Brother   . Cancer Maternal Aunt        Ovarian Cancer  . Cancer Maternal Uncle        Colon Cancer     HOME MEDICATIONS: Allergies as of 05/22/2019      Reactions   Lyrica [pregabalin]    Phenergan [promethazine]    Prochlorperazine       Medication List       Accurate as of May 22, 2019 11:59 PM. If you have any questions, ask your nurse or doctor.        STOP taking these medications   Insulin Lispro Prot & Lispro  (75-25) 100 UNIT/ML Kwikpen Commonly known as: HUMALOG 75/25 MIX Stopped by: Dorita Sciara, MD     TAKE these medications   aspirin 81 MG tablet Take 81 mg by mouth daily.   atorvastatin 10 MG tablet Commonly known as: LIPITOR Take 1 tab daily.   Belbuca 450 MCG Film Generic drug: Buprenorphine HCl Place 1 patch inside cheek 2 (two) times daily.   celecoxib 200 MG capsule Commonly known as: CELEBREX Take 200 mg by mouth daily.   cholecalciferol 25 MCG (1000 UT) tablet Commonly known as: VITAMIN D3 Take 1,000 Units by mouth daily.   docusate sodium 100 MG capsule Commonly known as: COLACE Take 100 mg by mouth 2 (two) times daily.   Emgality 120 MG/ML Soaj Generic drug: Galcanezumab-gnlm Inject into the skin.   estradiol 0.1 MG/24HR patch Commonly known as: [place] Place 1 patch (0.1 mg total) onto the skin 2 (two) times a week.   famotidine 40 MG tablet Commonly known as: PEPCID Take 40 mg by mouth daily.   FLUoxetine 40 MG capsule Commonly known as: PROZAC Take 40 mg by mouth daily.   insulin lispro 100 UNIT/ML KwikPen Commonly known as: HumaLOG KwikPen Inject 0.03 mLs (3 Units total) into the skin 3 (three) times daily. Started by: Dorita Sciara, MD   Lancets Misc Use daily to check blood sugar   Lantus SoloStar 100 UNIT/ML Solostar Pen Generic drug: Insulin Glargine Inject 8 Units into the skin daily. Started by: Dorita Sciara, MD   loratadine 10 MG tablet Commonly known as: CLARITIN Take 10 mg by mouth daily.   Melatonin 1 MG Tabs Take by mouth.   metoprolol succinate 25 MG 24 hr tablet Commonly known as: TOPROL-XL Take 1 tablet (25 mg total) by mouth daily.   multivitamin with minerals Tabs tablet Take 1 tablet by mouth daily.   NovoFine 32G X 6 MM Misc Generic drug: Insulin Pen Needle Use with insulin pen   omeprazole 20 MG capsule Commonly known as: PRILOSEC Take 1 capsule (20 mg total) by mouth 2 (two) times  daily. What changed: how much to take   ONE TOUCH ULTRA 2 w/Device Kit Use once daily to check blood sugar..   DX E11.9   OneTouch Ultra test strip Generic drug: glucose blood Use once daily to check blood sugar three times daily.  DXE11.9   oxycodone 5 MG capsule Commonly known as: OXY-IR Take 5 mg by mouth every 4 (four) hours as needed.   progesterone 100 MG capsule Commonly known as: PROMETRIUM Take 1 capsule (100 mg total) by mouth daily.   SUMAtriptan 20 MG/ACT nasal spray Commonly known as: [place]  1 spray into the nose every 2 (two) hours as needed for migraine.        OBJECTIVE:   Vital Signs: BP 118/64 (BP Location: Left Arm, Patient Position: Sitting, Cuff Size: Normal)   Pulse 74   Temp 98.3 F (36.8 C)   Ht '5\' 2"'  (1.575 m)   Wt 131 lb 6.4 oz (59.6 kg)   SpO2 98%   BMI 24.03 kg/m   Wt Readings from Last 3 Encounters:  05/22/19 131 lb 6.4 oz (59.6 kg)  05/12/19 131 lb 8 oz (59.6 kg)  04/28/19 127 lb 4 oz (57.7 kg)     Exam: General: Pt appears well and is in NAD  Neck: General: Supple without adenopathy. Thyroid: Thyroid size normal.  No goiter or nodules appreciated. No thyroid bruit.  Lungs: Clear with good BS bilat with no rales, rhonchi, or wheezes  Heart: RRR with normal S1 and S2 and no gallops; no murmurs; no rub  Abdomen: Normoactive bowel sounds, soft, nontender, without masses or organomegaly palpable  Extremities: No pretibial edema. No tremor. Normal strength and motion throughout.  Neuro: MS is good with appropriate affect, pt is alert and Ox3    DM foot exam:  05/22/2019  The skin of the feet is intact without sores or ulcerations. The pedal pulses are 2+ on right and 2+ on left. The sensation is decreased to a screening 5.07, 10 gram monofilament bilaterally - this is attributed to her spinal sx   DATA REVIEWED:  Lab Results  Component Value Date   HGBA1C 7.4 (A) 05/22/2019   HGBA1C 11.1 (H) 02/11/2019   Lab Results   Component Value Date   MICROALBUR <0.7 03/25/2019   LDLCALC 84 02/11/2019   CREATININE 0.70 04/06/2019   Lab Results  Component Value Date   MICRALBCREAT 1.2 03/25/2019     Lab Results  Component Value Date   CHOL 194 02/11/2019   HDL 75.40 02/11/2019   LDLCALC 84 02/11/2019   TRIG 175.0 (H) 02/11/2019   CHOLHDL 3 02/11/2019       Results for RYELEE, ALBEE (MRN [id number]) as of 05/22/2019 14:05  Ref. Range 04/06/2019 14:43 04/06/2019 15:02  Glutamic Acid Decarb Ab Latest Ref Range: <5 IU/mL >250 (H)   Islet Cell Ab Titer Latest Units: JDF units  1,280 (H)    ASSESSMENT / PLAN / RECOMMENDATIONS:   1) Latent Autoimmune Diabetes Mellitus, Newly Diagnosed - Most recent A1c of 7.4 %. Goal A1c < 7.0%  - Pt continues with fluctuating BG's with hypoglycemia and extreme hyperglycemia. I have explained to her that with her GI issues and the need to eat multiple times a day , it will be difficult  for her to reach optimal glucose goals, add to that her insulin sensitivity and carbohydrate sensitivity which is not unusual  - She is anxious which is understandable with a new diagnosis of LADA ,but she is also confused and mixed up about her insulin, she tells me she has been using the Humalog mix, but also is using regular humalog by giving herself  2 units of humalog if BG is > 200 mg, but again she gives me another correction scale, where she uses a correction factor of 40 ,I am not clear on where she receives these instructions and I am concerned about the safety of her insulin use.  - She was noted to take insulin before checking her glucose today and yesterday, on one of these occassions this was followed by a  hypoglycemia episode, she was counsel about the importance of checking glucose before taking her insulin for her safety, she was also counseled  about the rule of 15 for hypoglycemia correction, as she has been noted to have over correction.   - I will set her up with Ms. Leonia Reader  to discuss insulin pump options. If she is not a candidate, will consider a CGM.   - In the meantime will stop Humalog mix due to risk of hypoglycemia  - Pt with low insulin requirements at this time as she is on the honeymoon period.   MEDICATIONS: - Stop Humalog Mix  - Lantus 8 units daily  - Humalog 3 units  Three times a day with eating  - If sugar is over 200 before you eat, add 2 more units of Humalog to that meal    EDUCATION / INSTRUCTIONS:  BG monitoring instructions: Patient is instructed to check her blood sugars 4 times a day  Call [facility] if: BG persistently < 70 or > 300. . I reviewed the Rule of 15 for the treatment of hypoglycemia in detail with the patient. Literature supplied.  REFERRALS: CDE Ms. Vaughan Basta Spagnola    F/U in 2 weeks    Signed electronically by: Mack Guise, MD  [facility] End[facility]in[facility]  [facility] [address] [place], [place] [address] Phone: [phone] FAX: [phone]   CC: Shelda Pal, [address] [place] [address] Phone: [phone]  Fax: [phone]  Return to Endocrinology clinic as below: Future Appointments  Date Time Provider [place]  06/03/2019  2:40 PM , Melanie Crazier, MD [facility] None

## 2019-05-24 ENCOUNTER — Encounter: Payer: Self-pay | Admitting: Dietician

## 2019-05-25 ENCOUNTER — Encounter: Payer: Self-pay | Admitting: Family Medicine

## 2019-05-28 ENCOUNTER — Other Ambulatory Visit: Payer: Self-pay

## 2019-05-29 ENCOUNTER — Encounter: Payer: Self-pay | Admitting: Family Medicine

## 2019-05-29 ENCOUNTER — Other Ambulatory Visit: Payer: Self-pay

## 2019-05-29 ENCOUNTER — Ambulatory Visit: Payer: PRIVATE HEALTH INSURANCE | Admitting: Family Medicine

## 2019-05-29 VITALS — BP 108/62 | HR 88 | Temp 99.2°F | Ht 62.0 in | Wt 131.0 lb

## 2019-05-29 DIAGNOSIS — E1065 Type 1 diabetes mellitus with hyperglycemia: Secondary | ICD-10-CM | POA: Diagnosis not present

## 2019-05-29 NOTE — Patient Instructions (Addendum)
I would consider 1-2 units 3 times daily of the Humalog to make sure there is no hypoglycemia. If your sugars are not going low, increase to 3 units 3 times daily with meals.  Start the Lantus 8 units nightly.   It might be reasonable to take 1 unit if your sugars are above 200. You can always add more for future incidences.   Perhaps push back your appt w endo by 1-2 weeks.  Stay active.  Let us know if you need anything.

## 2019-05-29 NOTE — Progress Notes (Signed)
Chief Complaint  Patient presents with  . Follow-up    Subjective: Patient is a 63 y.o. female here for f/u DM.  Here to discuss changes endo made. Currently rec'd to stop Humalog mix and start BBI again.  She was told to start Lantus 8 units nightly in addition to 3 units of Humalog with meals.  She has a history of gastroparesis so she normally does not eat 3 regular meals a day but small sporadic meals.  Discussion of a pump was had. Last a1c was 7.4, improved from greater than 10.  Diet is relatively healthy.  She is having some lows.  When she takes a unit of Humalog, her sugars will drop approximately 60.  ROS: Endo: +hypoglycemia  Past Medical History:  Diagnosis Date  . Anxiety   . Colon polyp   . Gastroparesis   . GERD (gastroesophageal reflux disease)   . Hyperlipidemia   . Lupus ([place])   . Migraine headache   . Osteoarthritis   . Premature atrial contractions   . Raynaud's phenomenon   . Symptomatic menopausal or female climacteric states     Objective: BP 108/62 (BP Location: Left Arm, Patient Position: Sitting, Cuff Size: Normal)   Pulse 88   Temp 99.2 F (37.3 C) (Temporal)   Ht 5\' 2"  (1.575 m)   Wt 131 lb (59.4 kg)   SpO2 97%   BMI 23.96 kg/m  General: Awake, appears stated age Lungs: No accessory muscle use Psych: Age appropriate judgment and insight, normal affect and mood  Assessment and Plan: Uncontrolled type 1 diabetes mellitus with hyperglycemia ([place])  I did tell her that she needs to follow the endocrinologist's instructions overall.  If she is concerned with hypoglycemia, she can start with 1 unit if her sugars are over 200.  Additionally, she can try 1-2 units before meals.  Continue checking sugars and report back to her endocrinologist.  If her sugars are not well controlled on his current regimen, it sounds like they will discuss an insulin pump, which I think would be most beneficial.  The patient voiced understanding and agreement to the  plan.  [place], DO 05/29/19  3:00 PM

## 2019-06-02 ENCOUNTER — Encounter: Payer: Self-pay | Admitting: Family Medicine

## 2019-06-02 MED ORDER — ONETOUCH ULTRA VI STRP: ORAL_STRIP | 11 refills | Status: DC

## 2019-06-03 ENCOUNTER — Encounter: Payer: Self-pay | Admitting: Family Medicine

## 2019-06-03 ENCOUNTER — Ambulatory Visit: Payer: PRIVATE HEALTH INSURANCE | Admitting: Internal Medicine

## 2019-06-03 ENCOUNTER — Ambulatory Visit: Payer: PRIVATE HEALTH INSURANCE | Admitting: Nutrition

## 2019-06-03 MED ORDER — ONETOUCH ULTRA VI STRP: ORAL_STRIP | 11 refills | Status: AC

## 2019-06-08 ENCOUNTER — Other Ambulatory Visit: Payer: Self-pay | Admitting: Family Medicine

## 2019-06-08 DIAGNOSIS — I10 Essential (primary) hypertension: Secondary | ICD-10-CM

## 2019-06-18 ENCOUNTER — Other Ambulatory Visit: Payer: Self-pay | Admitting: Family Medicine

## 2019-06-18 DIAGNOSIS — N959 Unspecified menopausal and perimenopausal disorder: Secondary | ICD-10-CM

## 2019-06-25 ENCOUNTER — Other Ambulatory Visit: Payer: Self-pay

## 2019-06-29 ENCOUNTER — Encounter: Payer: PRIVATE HEALTH INSURANCE | Attending: Family Medicine | Admitting: Nutrition

## 2019-06-29 ENCOUNTER — Ambulatory Visit: Payer: PRIVATE HEALTH INSURANCE | Admitting: Internal Medicine

## 2019-06-29 ENCOUNTER — Other Ambulatory Visit: Payer: Self-pay

## 2019-06-29 ENCOUNTER — Encounter: Payer: Self-pay | Admitting: Internal Medicine

## 2019-06-29 VITALS — BP 120/80 | HR 78 | Ht 62.0 in | Wt 135.0 lb

## 2019-06-29 DIAGNOSIS — E139 Other specified diabetes mellitus without complications: Secondary | ICD-10-CM

## 2019-06-29 DIAGNOSIS — E119 Type 2 diabetes mellitus without complications: Secondary | ICD-10-CM | POA: Insufficient documentation

## 2019-06-29 MED ORDER — LANTUS SOLOSTAR 100 UNIT/ML ~~LOC~~ SOPN: 7.0000 [IU] | PEN_INJECTOR | Freq: Every day | SUBCUTANEOUS | 3 refills | Status: DC

## 2019-06-29 NOTE — Patient Instructions (Addendum)
-   Decrease Lantus to 7 units daily  - Humalog: 1 units for every 12 units of carbohydrate ( Divide the total number of Carbohydrate for that meal by 12 and that would give you the amount of humalog to give yourself for that meal)      HOW TO TREAT LOW BLOOD SUGARS (Blood sugar LESS THAN 70 MG/DL)  Please follow the RULE OF 15 for the treatment of hypoglycemia treatment (when your (blood sugars are less than 70 mg/dL)    STEP 1: Take 15 grams of carbohydrates when your blood sugar is low, which includes:   3-4 GLUCOSE TABS  OR  3-4 OZ OF JUICE OR REGULAR SODA OR  ONE TUBE OF GLUCOSE GEL     STEP 2: RECHECK blood sugar in 15 MINUTES STEP 3: If your blood sugar is still low at the 15 minute recheck --> then, go back to STEP 1 and treat AGAIN with another 15 grams of carbohydrates.

## 2019-06-29 NOTE — Progress Notes (Signed)
Name: Taylor [name]  Age/ Sex: 63 y.o., female   MRN/ DOB: [id number], 28-Jun-1956     PCP: Shelda Pal, DO   Reason for Endocrinology Evaluation: Type 1 Diabetes Mellitus  Initial Endocrine Consultative Visit: 04/06/19    PATIENT IDENTIFIER: Taylor [name] is a 63 y.o. female with a past medical history of T2DM, SLE, GERD, Gastroparesis. The patient has followed with Endocrinology clinic since 04/06/2019 for consultative assistance with management of her diabetes.  DIABETIC HISTORY:  Ms. Morel was diagnosed with T1DM in 01/2019. Her hemoglobin A1c was 11.1% on diagnosis. No DKA   Thyroid Nodule History :  She was found to have an incidental MNG on CT scan. A dominant right thyroid nodule of 1.6x0.9x1 cm was performed with benign cytology in 05/2015 Due to concerns of weight loss and feeling poorly a cortisol was checked by her endocrinologist in 05/2018 at 9.8 mcg/dL   SUBJECTIVE:   During the last visit (05/22/2019): We stopped the humalog mix and started MDI regimen   Today (06/29/2019): Ms. Seeman is here for a follow up on her diabetes management. She checks her blood sugars 5 times daily. The patient has had hypoglycemic episodes since the last clinic visit, which typically occur 1-3 x / week- most often occuring during the day. The patient is symptomatic with these episodes, with symptoms of shaking .  Otherwise, the patient has not required any recent emergency interventions for hypoglycemia and has not had recent hospitalizations secondary to hyper or hypoglycemic episodes.   She continues to have localized sweating over the anterior neck area at night, BG's normal at the time per pt   ROS: As per HPI and as detailed below: Review of Systems  Constitutional: Negative for chills and fever.  HENT: Negative for congestion and sore throat.   Respiratory: Negative for cough and shortness of breath.   Cardiovascular: Negative for chest pain and palpitations.   Gastrointestinal: Negative for abdominal pain and nausea.      HOME DIABETES REGIMEN:  - Lantus 8 units daily  - Humalog 3 units  Three times a day with eating     METER DOWNLOAD SUMMARY: Date range evaluated: 10/27-11/04/2019 Fingerstick Blood Glucose Tests = 72 Average Number Tests/Day = 5.1 Overall Mean FS Glucose = 152   BG Ranges: Low =57 High = 345   Hypoglycemic Events/30 Days: BG < 50 =0 Episodes of symptomatic severe hypoglycemia = 0     HISTORY:  Past Medical History:  Past Medical History:  Diagnosis Date  . Anxiety   . Colon polyp   . Gastroparesis   . GERD (gastroesophageal reflux disease)   . Hyperlipidemia   . Lupus ([place])   . Migraine headache   . Osteoarthritis   . Premature atrial contractions   . Raynaud's phenomenon   . Symptomatic menopausal or female climacteric states    Past Surgical History:  Past Surgical History:  Procedure Laterality Date  . CHOLECYSTECTOMY    . HEMORRHOID SURGERY    . HIP PINNING    . LAPAROSCOPY    . NASAL SINUS SURGERY    . TOTAL KNEE ARTHROPLASTY     left knee  . WRIST SURGERY     Left wrist    Social History:  reports that she has never smoked. She has never used smokeless tobacco. She reports that she does not drink alcohol or use drugs. Family History:  Family History  Problem Relation Age of Onset  . CVA Mother   .  Alzheimer's disease Mother   . Heart disease Father   . Diabetes Father   . Hypertension Father   . Hyperlipidemia Father   . CVA Brother   . Cancer Maternal Aunt        Ovarian Cancer  . Cancer Maternal Uncle        Colon Cancer     HOME MEDICATIONS: Allergies as of 06/29/2019      Reactions   Lyrica [pregabalin]    Phenergan [promethazine]    Prochlorperazine       Medication List       Accurate as of June 29, 2019  2:01 PM. If you have any questions, ask your nurse or doctor.        aspirin 81 MG tablet Take 81 mg by mouth daily.   atorvastatin 10 MG tablet  Commonly known as: LIPITOR Take 1 tab daily.   Belbuca 450 MCG Film Generic drug: Buprenorphine HCl Place 1 patch inside cheek 2 (two) times daily.   celecoxib 200 MG capsule Commonly known as: CELEBREX Take 200 mg by mouth daily.   cholecalciferol 25 MCG (1000 UT) tablet Commonly known as: VITAMIN D3 Take 1,000 Units by mouth daily.   docusate sodium 100 MG capsule Commonly known as: COLACE Take 100 mg by mouth 2 (two) times daily.   Emgality 120 MG/ML Soaj Generic drug: Galcanezumab-gnlm Inject into the skin.   estradiol 0.1 MG/24HR patch Commonly known as: Minivelle Place 1 patch (0.1 mg total) onto the skin 2 (two) times a week.   famotidine 40 MG tablet Commonly known as: PEPCID Take 40 mg by mouth daily.   FLUoxetine 40 MG capsule Commonly known as: PROZAC Take 40 mg by mouth daily.   insulin lispro 100 UNIT/ML KwikPen Commonly known as: HumaLOG KwikPen Inject 0.03 mLs (3 Units total) into the skin 3 (three) times daily.   Lancets Misc Use daily to check blood sugar   Lantus SoloStar 100 UNIT/ML Solostar Pen Generic drug: Insulin Glargine Inject 8 Units into the skin daily.   loratadine 10 MG tablet Commonly known as: CLARITIN Take 10 mg by mouth daily.   Melatonin 1 MG Tabs Take by mouth.   metoprolol succinate 25 MG 24 hr tablet Commonly known as: TOPROL-XL TAKE 1 TABLET BY MOUTH EVERY DAY   multivitamin with minerals Tabs tablet Take 1 tablet by mouth daily.   NovoFine 32G X 6 MM Misc Generic drug: Insulin Pen Needle Use with insulin pen   omeprazole 20 MG capsule Commonly known as: PRILOSEC Take 1 capsule (20 mg total) by mouth 2 (two) times daily. What changed: how much to take   ONE TOUCH ULTRA 2 w/Device Kit Use once daily to check blood sugar..   DX E11.9   OneTouch Ultra test strip Generic drug: glucose blood Use once daily to check blood sugar five times daily.  DXE11.9   oxycodone 5 MG capsule Commonly known as: OXY-IR  Take 5 mg by mouth every 4 (four) hours as needed.   progesterone 100 MG capsule Commonly known as: PROMETRIUM TAKE 1 CAPSULE BY MOUTH EVERY DAY   SUMAtriptan 20 MG/ACT nasal spray Commonly known as: IMITREX Place 1 spray into the nose every 2 (two) hours as needed for migraine.        OBJECTIVE:   Vital Signs: BP 120/80   Pulse 78   Ht _0  (1.575 m)   Wt 135 lb (61.2 kg)   SpO2 96%   BMI 24.69 kg/m  Wt Readings from Last 3 Encounters:  06/29/19 135 lb (61.2 kg)  05/29/19 131 lb (59.4 kg)  05/22/19 131 lb 6.4 oz (59.6 kg)     Exam: General: Pt appears well and is in NAD  Neck: General: Supple without adenopathy. Thyroid: Thyroid size normal.  No goiter or nodules appreciated. No thyroid bruit.  Lungs: Clear with good BS bilat with no rales, rhonchi, or wheezes  Heart: RRR with normal S1 and S2 and no gallops; no murmurs; no rub  Abdomen: Normoactive bowel sounds, soft, nontender, without masses or organomegaly palpable  Extremities: No pretibial edema. No tremor. Normal strength and motion throughout.  Neuro: MS is good with appropriate affect, pt is alert and Ox3    DM foot exam:  05/22/2019  The skin of the feet is intact without sores or ulcerations. The pedal pulses are 2+ on right and 2+ on left. The sensation is decreased to a screening 5.07, 10 gram monofilament bilaterally - this is attributed to her spinal sx   DATA REVIEWED:  Lab Results  Component Value Date   HGBA1C 7.4 (A) 05/22/2019   HGBA1C 11.1 (H) 02/11/2019   Lab Results  Component Value Date   MICROALBUR <0.7 03/25/2019   LDLCALC 84 02/11/2019   CREATININE 0.70 04/06/2019   Lab Results  Component Value Date   MICRALBCREAT 1.2 03/25/2019     Lab Results  Component Value Date   CHOL 194 02/11/2019   HDL 75.40 02/11/2019   LDLCALC 84 02/11/2019   TRIG 175.0 (H) 02/11/2019   CHOLHDL 3 02/11/2019       Results for CATLIN, DORIA (MRN [id number]) as of 05/22/2019 14:05  Ref.  Range 04/06/2019 14:43 04/06/2019 15:02  Glutamic Acid Decarb Ab Latest Ref Range: <5 IU/mL >250 (H)   Islet Cell Ab Titer Latest Units: JDF units  1,280 (H)    ASSESSMENT / PLAN / RECOMMENDATIONS:   1) Latent Autoimmune Diabetes Mellitus, Newly Diagnosed - Most recent A1c of 7.4 %. Goal A1c < 7.0%  - She is having less hypoglycemic episodes, she is also more tolerable of then withOUT hypoglycemia unawareness.  -  She is more satisfied with the current regimen of  Lantus/Humalog then she did with the humalog mix. Her hypoglycemia is due to insulin :carb mismatch  - She met with Ms. Spagnola this morning, in the process of obtaining the T-Slim - She agreed to start working on the carb counting , she will meet with laura in 1.5 weeks to get official training.    MEDICATIONS:  - Decrease Lantus to 7 units daily  - Humalog: I:C ratio of 1:12    EDUCATION / INSTRUCTIONS:  BG monitoring instructions: Patient is instructed to check her blood sugars 4 times a day  Call [facility] if: BG persistently < 70 or > 300. . I reviewed the Rule of 15 for the treatment of hypoglycemia in detail with the patient. Literature supplied.      F/U in 3 weeks    Signed electronically by: Mack Guise, MD  [facility]  [facility] [address] [place], [place] [address] Phone: [phone] FAX: [phone]   CC: Shelda Pal, [address] [place] [address] Phone: [phone]  Fax: [phone]  Return to Endocrinology clinic as below: No future appointments.

## 2019-07-01 NOTE — Progress Notes (Signed)
We discussed how insulin pumps work, and the 3 models that are on the market.  She was shown the 3 models with their respective CGMs, and we discussed the advantages and disadvantages of each model.   We also discussed the idea of an extended bolus for her gastroparesis, and how this may help with better control.  After review of each pump, she decided on the Tandem pump.  Paperwork was filled out and faxed to [employer] and Dex[employer] for insurance verification and cost.  She was told to call me when her pump/cgm comes in to set up an appointment for the training.  She agreed to do this, and had no final questions

## 2019-07-02 ENCOUNTER — Telehealth: Payer: Self-pay

## 2019-07-02 NOTE — Telephone Encounter (Signed)
Needing scripts faxed for the dexcom supplies

## 2019-07-02 NOTE — Telephone Encounter (Signed)
Last chart note reviewed, it does not appear she is on the [place], and it is not clear from the message who called and where the supplies need to be sent to.   Please advise.

## 2019-07-07 ENCOUNTER — Other Ambulatory Visit: Payer: Self-pay

## 2019-07-07 DIAGNOSIS — E139 Other specified diabetes mellitus without complications: Secondary | ICD-10-CM

## 2019-07-07 MED ORDER — DEXCOM G6 RECEIVER DEVI: 1.0000 | 0 refills | Status: AC

## 2019-07-07 MED ORDER — DEXCOM G6 SENSOR MISC: 1.0000 | 2 refills | Status: AC

## 2019-07-07 MED ORDER — DEXCOM G6 TRANSMITTER MISC: 1.0000 | 2 refills | Status: AC

## 2019-07-13 NOTE — Patient Instructions (Signed)
Read over information given, and call if questions. Fill out paperwork on pump/CGM model you prefer, and fax

## 2019-07-20 ENCOUNTER — Other Ambulatory Visit: Payer: Self-pay | Admitting: Family Medicine

## 2019-07-20 ENCOUNTER — Other Ambulatory Visit: Payer: Self-pay

## 2019-07-20 MED ORDER — BD PEN NEEDLE NANO 2ND GEN 32G X 4 MM MISC: 0 refills | Status: DC

## 2019-07-21 ENCOUNTER — Encounter: Payer: Self-pay | Admitting: Internal Medicine

## 2019-07-21 ENCOUNTER — Ambulatory Visit: Payer: PRIVATE HEALTH INSURANCE | Admitting: Internal Medicine

## 2019-07-21 VITALS — BP 118/72 | HR 78 | Temp 98.6°F | Ht 62.0 in | Wt 135.8 lb

## 2019-07-21 DIAGNOSIS — E139 Other specified diabetes mellitus without complications: Secondary | ICD-10-CM

## 2019-07-21 NOTE — Patient Instructions (Addendum)
-   Decrease Lantus to 6 units daily  - Humalog: 1 unit of Humalog  for every 12 units of carbohydrate ( Divide the total number of Carbohydrate for that meal by 12,  and that would give you the amount of humalog to give yourself for that meal)  - Humalog correctional insulin: ADD extra units on insulin to your meal-time Humalog dose if your blood sugars are higher than 200. Use the scale below to help guide you:   Blood sugar before meal Number of units to inject  Less than 200 0 unit  201 -  280 1 units  281-  360 2 units  361 -  440 3 units         HOW TO TREAT LOW BLOOD SUGARS (Blood sugar LESS THAN 70 MG/DL)  Please follow the RULE OF 15 for the treatment of hypoglycemia treatment (when your (blood sugars are less than 70 mg/dL)    STEP 1: Take 15 grams of carbohydrates when your blood sugar is low, which includes:   3-4 GLUCOSE TABS  OR  3-4 OZ OF JUICE OR REGULAR SODA OR  ONE TUBE OF GLUCOSE GEL     STEP 2: RECHECK blood sugar in 15 MINUTES STEP 3: If your blood sugar is still low at the 15 minute recheck --> then, go back to STEP 1 and treat AGAIN with another 15 grams of carbohydrates.

## 2019-07-21 NOTE — Progress Notes (Signed)
Name: Taylor [name]  Age/ Sex: 63 y.o., female   MRN/ DOB: [id number], 04/18/1956     PCP: Shelda Pal, DO   Reason for Endocrinology Evaluation: Type 1 Diabetes Mellitus  Initial Endocrine Consultative Visit: 04/06/19    PATIENT IDENTIFIER: Taylor [name] is a 63 y.o. female with a past medical history of T1DM, SLE, GERD, Gastroparesis. The patient has followed with Endocrinology clinic since 04/06/2019 for consultative assistance with management of her diabetes.  DIABETIC HISTORY:  Taylor [name] was diagnosed with T1DM in 01/2019. Her hemoglobin A1c was 11.1% on diagnosis. No DKA. Islet cell Ab's positive. She was initially was put on an insulin mix but due to fluctuating BG's she was changed to an MDI regimen.     Thyroid Nodule History :  She was found to have an incidental MNG on CT scan. A dominant right thyroid nodule of 1.6x0.9x1 cm was performed with benign cytology in 05/2015 Due to concerns of weight loss and feeling poorly a cortisol was checked by her endocrinologist in 05/2018 at 9.8 mcg/dL   SUBJECTIVE:   During the last visit (06/29/2019): We reduced lantus and started I:C ratio of 1:12    Today (07/21/2019): Taylor [name] is here for a follow up on her diabetes management. She checks her blood sugars 5 times daily. The patient has had hypoglycemic episodes since the last clinic visit, which typically occur 1-3 x / week- most often occuring during the day. The patient is symptomatic with these episodes, with symptoms of shaking .  Otherwise, the patient has not required any recent emergency interventions for hypoglycemia and has not had recent hospitalizations secondary to hyper or hypoglycemic episodes.    ROS: As per HPI and as detailed below: Review of Systems  Constitutional: Negative for chills and fever.  HENT: Negative for congestion and sore throat.   Respiratory: Negative for cough and shortness of breath.   Cardiovascular: Negative for chest pain  and palpitations.  Gastrointestinal: Negative for abdominal pain and nausea.      HOME DIABETES REGIMEN:  - Lantus 7 units daily  - Humalog: I:C ratio of 1: 12    METER DOWNLOAD SUMMARY: Date range evaluated: 111/18-07/21/2019 Fingerstick Blood Glucose Tests = 70 Average Number Tests/Day =  5.0 Overall Mean FS Glucose = 135   BG Ranges: Low = 40 High = 279   Hypoglycemic Events/30 Days: BG < 50 = 1 Episodes of symptomatic severe hypoglycemia = 0     HISTORY:  Past Medical History:  Past Medical History:  Diagnosis Date  . Anxiety   . Colon polyp   . Gastroparesis   . GERD (gastroesophageal reflux disease)   . Hyperlipidemia   . Lupus ([place])   . Migraine headache   . Osteoarthritis   . Premature atrial contractions   . Raynaud's phenomenon   . Symptomatic menopausal or female climacteric states    Past Surgical History:  Past Surgical History:  Procedure Laterality Date  . CHOLECYSTECTOMY    . HEMORRHOID SURGERY    . HIP PINNING    . LAPAROSCOPY    . NASAL SINUS SURGERY    . TOTAL KNEE ARTHROPLASTY     left knee  . WRIST SURGERY     Left wrist    Social History:  reports that she has never smoked. She has never used smokeless tobacco. She reports that she does not drink alcohol or use drugs. Family History:  Family History  Problem Relation Age of  Onset  . CVA Mother   . Alzheimer's disease Mother   . Heart disease Father   . Diabetes Father   . Hypertension Father   . Hyperlipidemia Father   . CVA Brother   . Cancer Maternal Aunt        Ovarian Cancer  . Cancer Maternal Uncle        Colon Cancer     HOME MEDICATIONS: Allergies as of 07/21/2019      Reactions   Lyrica [pregabalin]    Phenergan [promethazine]    Prochlorperazine       Medication List       Accurate as of July 21, 2019  2:58 PM. If you have any questions, ask your nurse or doctor.        aspirin 81 MG tablet Take 81 mg by mouth daily.   atorvastatin 10 MG  tablet Commonly known as: LIPITOR Take 1 tab daily.   Belbuca 450 MCG Film Generic drug: Buprenorphine HCl Place 1 patch inside cheek 2 (two) times daily.   celecoxib 200 MG capsule Commonly known as: CELEBREX Take 200 mg by mouth daily.   cholecalciferol 25 MCG (1000 UT) tablet Commonly known as: VITAMIN D3 Take 1,000 Units by mouth daily.   Dexcom G6 Receiver Devi 1 each by Does not apply route See admin instructions. For continuous glucose monitoring; E13.9   Dexcom G6 Sensor Misc 1 each by Does not apply route See admin instructions. For use with continuous glucose monitoring system. Change sensor every 10 days; E13.9   Dexcom G6 Transmitter Misc 1 each by Does not apply route See admin instructions. For continuous glucose monitoring; E13.9   docusate sodium 100 MG capsule Commonly known as: COLACE Take 100 mg by mouth 2 (two) times daily.   Emgality 120 MG/ML Soaj Generic drug: Galcanezumab-gnlm Inject into the skin.   estradiol 0.1 MG/24HR patch Commonly known as: Minivelle Place 1 patch (0.1 mg total) onto the skin 2 (two) times a week.   famotidine 40 MG tablet Commonly known as: PEPCID Take 40 mg by mouth daily.   FLUoxetine 40 MG capsule Commonly known as: PROZAC Take 40 mg by mouth daily.   insulin lispro 100 UNIT/ML KwikPen Commonly known as: HumaLOG KwikPen Inject 0.03 mLs (3 Units total) into the skin 3 (three) times daily.   Lancets Misc Use daily to check blood sugar   Lantus SoloStar 100 UNIT/ML Solostar Pen Generic drug: Insulin Glargine Inject 7 Units into the skin daily.   loratadine 10 MG tablet Commonly known as: CLARITIN Take 10 mg by mouth daily.   Melatonin 1 MG Tabs Take by mouth.   metoprolol succinate 25 MG 24 hr tablet Commonly known as: TOPROL-XL TAKE 1 TABLET BY MOUTH EVERY DAY   multivitamin with minerals Tabs tablet Take 1 tablet by mouth daily.   NovoFine 32G X 6 MM Misc Generic drug: Insulin Pen Needle Use with  insulin pen   BD Pen Needle Nano 2nd Gen 32G X 4 MM Misc Generic drug: Insulin Pen Needle Use as need for insulin   omeprazole 20 MG capsule Commonly known as: PRILOSEC Take 1 capsule (20 mg total) by mouth 2 (two) times daily. What changed: how much to take   ONE TOUCH ULTRA 2 w/Device Kit Use once daily to check blood sugar..   DX E11.9   OneTouch Ultra test strip Generic drug: glucose blood Use once daily to check blood sugar five times daily.  DXE11.9   oxycodone 5  MG capsule Commonly known as: OXY-IR Take 5 mg by mouth every 4 (four) hours as needed.   progesterone 100 MG capsule Commonly known as: PROMETRIUM TAKE 1 CAPSULE BY MOUTH EVERY DAY   SUMAtriptan 20 MG/ACT nasal spray Commonly known as: IMITREX Place 1 spray into the nose every 2 (two) hours as needed for migraine.        OBJECTIVE:   Vital Signs: BP 118/72 (BP Location: Right Arm, Patient Position: Sitting, Cuff Size: Normal)   Pulse 78   Temp 98.6 F (37 C)   Ht _0  (1.575 m)   Wt 135 lb 12.8 oz (61.6 kg)   SpO2 98%   BMI 24.84 kg/m   Wt Readings from Last 3 Encounters:  07/21/19 135 lb 12.8 oz (61.6 kg)  06/29/19 135 lb (61.2 kg)  05/29/19 131 lb (59.4 kg)     Exam: General: Pt appears well and is in NAD  Neck: General: Supple without adenopathy. Thyroid: Thyroid size normal.  No goiter or nodules appreciated. No thyroid bruit.  Lungs: Clear with good BS bilat with no rales, rhonchi, or wheezes  Heart: RRR with normal S1 and S2 and no gallops; no murmurs; no rub  Abdomen: Normoactive bowel sounds, soft, nontender, without masses or organomegaly palpable  Extremities: No pretibial edema. No tremor. Normal strength and motion throughout.  Neuro: MS is good with appropriate affect, pt is alert and Ox3    DM foot exam:  05/22/2019  The skin of the feet is intact without sores or ulcerations. The pedal pulses are 2+ on right and 2+ on left. The sensation is decreased to a screening  5.07, 10 gram monofilament bilaterally - this is attributed to her spinal sx   DATA REVIEWED:  Lab Results  Component Value Date   HGBA1C 7.4 (A) 05/22/2019   HGBA1C 11.1 (H) 02/11/2019   Lab Results  Component Value Date   MICROALBUR <0.7 03/25/2019   LDLCALC 84 02/11/2019   CREATININE 0.70 04/06/2019   Lab Results  Component Value Date   MICRALBCREAT 1.2 03/25/2019     Lab Results  Component Value Date   CHOL 194 02/11/2019   HDL 75.40 02/11/2019   LDLCALC 84 02/11/2019   TRIG 175.0 (H) 02/11/2019   CHOLHDL 3 02/11/2019       Results for SPARROW, SIRACUSA (MRN [id number]) as of 05/22/2019 14:05  Ref. Range 04/06/2019 14:43 04/06/2019 15:02  Glutamic Acid Decarb Ab Latest Ref Range: <5 IU/mL >250 (H)   Islet Cell Ab Titer Latest Units: JDF units  1,280 (H)    ASSESSMENT / PLAN / RECOMMENDATIONS:   1) Latent Autoimmune Diabetes Mellitus, Newly Diagnosed - Most recent A1c of 7.4 %. Goal A1c < 7.0%  - She is having less hypoglycemic and hyperglycemic episodes. She is having less severe symptoms with BG's in the 60's which concerns me at this time.  - She is upset about her weight gain and wondering what we can do. I have encouraged her to exercise when possible and to continue to monitor her diet.  -  She has done well overall with the MDI regimen, I suspect she needs less basal insulin at this time. - She will hold off on obtaining the T-slim due to high co-pay until the beginning of the year  - She will also be provided with a correction scale per her request   MEDICATIONS:  - Decrease Lantus to 6 units daily  - Humalog: I:C ratio of 1:12  -  CF : Humalog (BG - 120/80)    EDUCATION / INSTRUCTIONS:  BG monitoring instructions: Patient is instructed to check her blood sugars 4 times a day  Call [facility] if: BG persistently < 70 or > 300. . I reviewed the Rule of 15 for the treatment of hypoglycemia in detail with the patient. Literature supplied.       F/U in 8 weeks    Signed electronically by: Mack Guise, MD  [facility]  [facility] [address] [place], [place] [address] Phone: [phone] FAX: [phone]   CC: Shelda Pal, [address] [place]  [address] Phone: [phone]  Fax: [phone]  Return to Endocrinology clinic as below: Future Appointments  Date Time Provider [place]  09/15/2019  1:00 PM Shantaya Bluestone, Melanie Crazier, MD [id number] None

## 2019-07-24 ENCOUNTER — Encounter: Payer: Self-pay | Admitting: Family Medicine

## 2019-07-24 ENCOUNTER — Other Ambulatory Visit: Payer: Self-pay

## 2019-07-24 DIAGNOSIS — N959 Unspecified menopausal and perimenopausal disorder: Secondary | ICD-10-CM

## 2019-07-24 MED ORDER — ESTRADIOL 0.1 MG/24HR TD PTTW: 1.0000 | MEDICATED_PATCH | TRANSDERMAL | 11 refills | Status: DC

## 2019-08-07 ENCOUNTER — Other Ambulatory Visit: Payer: Self-pay | Admitting: Family Medicine

## 2019-08-07 DIAGNOSIS — E782 Mixed hyperlipidemia: Secondary | ICD-10-CM

## 2019-08-07 MED ORDER — ATORVASTATIN CALCIUM 10 MG PO TABS: ORAL_TABLET | ORAL | 1 refills | Status: DC

## 2019-08-12 ENCOUNTER — Other Ambulatory Visit: Payer: Self-pay

## 2019-08-12 ENCOUNTER — Telehealth: Payer: Self-pay | Admitting: Internal Medicine

## 2019-08-12 MED ORDER — BD PEN NEEDLE NANO 2ND GEN 32G X 4 MM MISC: 6 refills | Status: AC

## 2019-08-12 MED ORDER — LANCETS MISC: 6 refills | Status: AC

## 2019-08-12 NOTE — Telephone Encounter (Signed)
Called pt to verify testing frequency also spoke to pharmacist and clarified what scripts were old and needed to be discarded.

## 2019-08-12 NOTE — Telephone Encounter (Signed)
Patient called to advise that the prescriptions for her pen needles, test strips, and lancets need to be adjusted to reflect the fact that she test, etc at least 5x per day.  Pharmacy is [facility] on [address]  Any questions or for clarification is [phone]

## 2019-09-03 ENCOUNTER — Ambulatory Visit: Payer: Self-pay

## 2019-09-03 ENCOUNTER — Ambulatory Visit (INDEPENDENT_AMBULATORY_CARE_PROVIDER_SITE_OTHER): Payer: PRIVATE HEALTH INSURANCE

## 2019-09-03 ENCOUNTER — Ambulatory Visit: Payer: PRIVATE HEALTH INSURANCE | Admitting: Orthopaedic Surgery

## 2019-09-03 ENCOUNTER — Other Ambulatory Visit: Payer: Self-pay

## 2019-09-03 ENCOUNTER — Encounter: Payer: Self-pay | Admitting: Orthopaedic Surgery

## 2019-09-03 VITALS — Ht 62.0 in | Wt 135.0 lb

## 2019-09-03 DIAGNOSIS — M25511 Pain in right shoulder: Secondary | ICD-10-CM

## 2019-09-03 DIAGNOSIS — M25552 Pain in left hip: Secondary | ICD-10-CM | POA: Diagnosis not present

## 2019-09-03 DIAGNOSIS — G8929 Other chronic pain: Secondary | ICD-10-CM

## 2019-09-03 DIAGNOSIS — M25512 Pain in left shoulder: Secondary | ICD-10-CM

## 2019-09-03 MED ORDER — LIDOCAINE HCL 2 % IJ SOLN
2.0000 mL | INTRAMUSCULAR | Status: AC | PRN
  Administered 2019-09-03: 2 mL

## 2019-09-03 MED ORDER — BUPIVACAINE HCL 0.5 % IJ SOLN
2.0000 mL | INTRAMUSCULAR | Status: AC | PRN
  Administered 2019-09-03: 2 mL via INTRA_ARTICULAR

## 2019-09-03 MED ORDER — METHYLPREDNISOLONE ACETATE 40 MG/ML IJ SUSP
80.0000 mg | INTRAMUSCULAR | Status: AC | PRN
  Administered 2019-09-03: 80 mg via INTRA_ARTICULAR

## 2019-09-03 NOTE — Progress Notes (Signed)
Office Visit Note   Patient: Taylor [name]           Date of Birth: [birth year]           MRN: [id number] Visit Date: 09/03/2019              Requested by: Shelda Pal, [address] [place],  [place] [address] PCP: Shelda Pal, DO   Assessment & Plan: Visit Diagnoses:  1. Chronic left shoulder pain   2. Chronic right shoulder pain   3. Chronic pain of both shoulders     Plan: Left shoulder pain could be simply impingement and possibly a small rotator cuff tear.  Has an old humeral head fracture in excellent position.  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  We will monitor that and consider an MRI scan over time.  She is having more trouble with her right shoulder with evidence of calcific tendinitis and probably biceps pathology based on her exam.  Long discussion regarding treatment options including obtaining an MRI scan.  She would like to try cortisone injection.  She is a type I diabetic on insulin and may have an elevated blood sugar after the injection she will monitor that.  Injected the area of most tenderness over the biceps tendon and we will plan to see her back as needed.  She can always call and set up an MRI scan  Follow-Up Instructions: Return if symptoms worsen or fail to improve.   Orders:  Orders Placed This Encounter  Procedures  . Large Joint Inj: R subacromial bursa  . XR Shoulder Left  . XR Shoulder Right   No orders of the defined types were placed in this encounter.     Procedures: Large Joint Inj: R subacromial bursa on 09/03/2019 1:44 PM Indications: pain and diagnostic evaluation Details: 25 G 1.5 in needle, anteromedial approach  Arthrogram: No  Medications: 2 mL lidocaine 2 %; 2 mL bupivacaine 0.5 %; 80 mg methylPREDNISolone acetate 40 MG/ML Consent was given by the patient. Immediately prior to procedure a time out was called to verify the correct patient, procedure,  equipment, support staff and site/side marked as required. Patient was prepped and draped in the usual sterile fashion.       Clinical Data: No additional findings.   Subjective: Chief Complaint  Patient presents with  . Right Shoulder - Pain  . Left Shoulder - Pain  Patient presents today for bilateral shoulder pain. She said that they have hurt for years, but really progressed in the last year. The right side is worse than the left. She is right hand dominant. Her range of motion "is pretty good". She said that she has a history of shoulder fracture on the left. She has some tingling in her right upper arm. She takes Belbuca, Oxycodone, celebrex, and tylenol. No previous shoulder surgery.  Type 1 insulin-dependent diabetes. Old nondisplaced left humeral head fracture with recent onset of pain.  More trouble on the right where she is having some difficulty with overhead activity and sleeping on that side.  Pain is referred into the biceps muscle  HPI  Review of Systems  Constitutional: Positive for fatigue.  HENT: Negative for ear pain.   Eyes: Negative for pain.  Respiratory: Negative for shortness of breath.   Cardiovascular: Positive for leg swelling.  Gastrointestinal: Positive for constipation. Negative for diarrhea.  Endocrine: Negative for cold intolerance and heat intolerance.  Genitourinary:  Negative for difficulty urinating.  Musculoskeletal: Negative for joint swelling.  Skin: Negative for rash.  Allergic/Immunologic: Negative for food allergies.  Neurological: Positive for weakness.  Hematological: Does not bruise/bleed easily.  Psychiatric/Behavioral: Positive for sleep disturbance.     Objective: Vital Signs: Ht 5\' 2"  (1.575 m)   Wt 135 lb (61.2 kg)   BMI 24.69 kg/m   Physical Exam Constitutional:      Appearance: She is well-developed.  Eyes:     Pupils: Pupils are equal, round, and reactive to light.  Pulmonary:     Effort: Pulmonary effort is  normal.  Skin:    General: Skin is warm and dry.  Neurological:     Mental Status: She is alert and oriented to person, place, and time.  Psychiatric:        Behavior: Behavior normal.     Ortho Exam left shoulder with surprisingly good motion considering that she has had a prior humeral head fracture.  She could place her arm just about fully overhead.  Lacked a little bit of full internal rotation but could touch the lower part of her back.  Negative impingement and empty can testing.  No crepitation.  No pain over the biceps.  Skin intact and excellent strength good grip and good release.  Right shoulder with some tenderness over the supraspinatus insertion on the humeral head but more over the biceps tendon with a positive Speed sign.  Good grip and release.  Excellent strength.  Just about full overhead motion and a little loss of internal rotation similar to the to the left side but certainly not functional limitation.  No neck pain  Specialty Comments:  No specialty comments available.  Imaging: XR Shoulder Left  Result Date: 09/03/2019 Films of the left shoulder obtained in several projections.  There is an old humeral head fracture in good position.  No evidence of arthritis.  Humeral head is centered about the glenoid.  Normal space between the humeral head and the acromion.  No ectopic calcification.  XR Shoulder Right  Result Date: 09/03/2019 Films of the right shoulder obtained in several projections.  There is calcification along the insertion of the supraspinatus near the humeral head.  There are some degenerative changes at the [facility] joint with some ectopic calcification as well.  Humeral head is centered about the glenoid and a normal space between the humeral head and the acromion.  Films are consistent with calcific tendinitis    PMFS History: Patient Active Problem List   Diagnosis Date Noted  . Bilateral shoulder pain 09/03/2019  . Latent autoimmune diabetes in  adults (LADA), managed as type 1 (HCC) 05/22/2019  . Uncontrolled type 1 diabetes mellitus with hyperglycemia (HCC) 04/28/2019  . Diabetes mellitus type 2 in nonobese (HCC) 02/14/2019  . Chronic nonintractable headache 05/14/2018  . Gait instability 05/14/2018  . Gastroparesis 02/12/2018  . Essential hypertension, benign 04/18/2014  . Irritable bowel syndrome with constipation 11/15/2013  . Loss of weight 11/15/2013  . Acute upper respiratory infections of unspecified site 11/15/2013  . Post menopausal problems 11/15/2013  . GERD (gastroesophageal reflux disease) 03/23/2013  . Cerumen impaction 03/23/2013  . Unspecified constipation 03/23/2013  . Insomnia 03/23/2013  . Mixed hyperlipidemia 03/23/2013  . Palpitations 03/23/2013   Past Medical History:  Diagnosis Date  . Anxiety   . Colon polyp   . Gastroparesis   . GERD (gastroesophageal reflux disease)   . Hyperlipidemia   . Lupus (HCC)   . Migraine headache   .  Osteoarthritis   . Premature atrial contractions   . Raynaud's phenomenon   . Symptomatic menopausal or female climacteric states     Family History  Problem Relation Age of Onset  . CVA Mother   . Alzheimer's disease Mother   . Heart disease Father   . Diabetes Father   . Hypertension Father   . Hyperlipidemia Father   . CVA Brother   . Cancer Maternal Aunt        Ovarian Cancer  . Cancer Maternal Uncle        Colon Cancer    Past Surgical History:  Procedure Laterality Date  . CHOLECYSTECTOMY    . HEMORRHOID SURGERY    . HIP PINNING    . LAPAROSCOPY    . NASAL SINUS SURGERY    . TOTAL KNEE ARTHROPLASTY     left knee  . WRIST SURGERY     Left wrist   Social History   Occupational History  . Occupation: HOMEMAKER  Tobacco Use  . Smoking status: Never Smoker  . Smokeless tobacco: Never Used  Substance and Sexual Activity  . Alcohol use: No  . Drug use: No  . Sexual activity: Not on file

## 2019-09-11 ENCOUNTER — Other Ambulatory Visit: Payer: Self-pay

## 2019-09-14 ENCOUNTER — Telehealth: Payer: Self-pay | Admitting: Nutrition

## 2019-09-14 NOTE — Telephone Encounter (Signed)
Message left to call me to schedule pump start. 

## 2019-09-15 ENCOUNTER — Encounter: Payer: Self-pay | Admitting: Internal Medicine

## 2019-09-15 ENCOUNTER — Ambulatory Visit: Payer: PRIVATE HEALTH INSURANCE | Admitting: Internal Medicine

## 2019-09-15 ENCOUNTER — Other Ambulatory Visit: Payer: Self-pay

## 2019-09-15 VITALS — BP 118/78 | HR 74 | Temp 98.5°F | Ht 62.0 in | Wt 135.2 lb

## 2019-09-15 DIAGNOSIS — E139 Other specified diabetes mellitus without complications: Secondary | ICD-10-CM | POA: Diagnosis not present

## 2019-09-15 LAB — POCT GLYCOSYLATED HEMOGLOBIN (HGB A1C): Hemoglobin A1C: 6.3 % — AB (ref 4.0–5.6)

## 2019-09-15 NOTE — Patient Instructions (Signed)
-   Take Lantus 3 units tomorrow morning  - Humalog : insulin to carbohydrate ratio 1 unit of humalog for every 10 units of carbohydrate   HOW TO TREAT LOW BLOOD SUGARS (Blood sugar LESS THAN 70 MG/DL)  Please follow the RULE OF 15 for the treatment of hypoglycemia treatment (when your (blood sugars are less than 70 mg/dL)    STEP 1: Take 15 grams of carbohydrates when your blood sugar is low, which includes:   3-4 GLUCOSE TABS  OR  3-4 OZ OF JUICE OR REGULAR SODA OR  ONE TUBE OF GLUCOSE GEL     STEP 2: RECHECK blood sugar in 15 MINUTES STEP 3: If your blood sugar is still low at the 15 minute recheck --> then, go back to STEP 1 and treat AGAIN with another 15 grams of carbohydrates.

## 2019-09-15 NOTE — Progress Notes (Signed)
Name: Taylor [name]  Age/ Sex: 64 y.o., female   MRN/ DOB: [id number], 05/30/1956     PCP: Shelda Pal, DO   Reason for Endocrinology Evaluation: Type 1 Diabetes Mellitus  Initial Endocrine Consultative Visit: 04/06/19    PATIENT IDENTIFIER: Taylor [name] is a 64 y.o. female with a past medical history of T1DM, SLE, GERD, Gastroparesis. The patient has followed with Endocrinology clinic since 04/06/2019 for consultative assistance with management of her diabetes.  DIABETIC HISTORY:  Taylor [name] was diagnosed with T1DM in 01/2019. Her hemoglobin A1c was 11.1% on diagnosis. No DKA. Islet cell Ab's positive. She was initially was put on an insulin mix but due to fluctuating BG's she was changed to an MDI regimen.     Thyroid Nodule History :  She was found to have an incidental MNG on CT scan. A dominant right thyroid nodule of 1.6x0.9x1 cm was performed with benign cytology in 05/2015 Due to concerns of weight loss and feeling poorly a cortisol was checked by her endocrinologist in 05/2018 at 9.8 mcg/dL   SUBJECTIVE:   During the last visit (06/29/2019): We reduced lantus and started I:C ratio of 1:12    Today (09/15/2019): Taylor [name] is here for a follow up on her diabetes management. She checks her blood sugars 5 times daily. The patient has had hypoglycemic episodes since the last clinic visit, which typically occur less frequently, no more fasting hypoglycemia.   Otherwise, the patient has not required any recent emergency interventions for hypoglycemia and has not had recent hospitalizations secondary to hyper or hypoglycemic episodes.    Received a steroid injection to the right shoulder on 14th. Noted hyperglycemia since then  Continues to be concerned about her weight      ROS: As per HPI and as detailed below: Review of Systems  Constitutional: Negative for chills and fever.  HENT: Negative for congestion and sore throat.   Respiratory: Negative for cough  and shortness of breath.   Cardiovascular: Negative for chest pain and palpitations.  Gastrointestinal: Negative for abdominal pain and nausea.      HOME DIABETES REGIMEN:  - Lantus 6 units daily  - Humalog: I:C ratio of 1:12 - CF : Humalog (BG-120/80)    METER DOWNLOAD SUMMARY: Date range evaluated: 1/13-1/26/2020 Fingerstick Blood Glucose Tests = 81 Average Number Tests/Day =  5.8 Overall Mean FS Glucose = 188   BG Ranges: Low = 63 High = 238   Hypoglycemic Events/30 Days: BG < 50 = 1 Episodes of symptomatic severe hypoglycemia = 0     HISTORY:  Past Medical History:  Past Medical History:  Diagnosis Date  . Anxiety   . Colon polyp   . Gastroparesis   . GERD (gastroesophageal reflux disease)   . Hyperlipidemia   . Lupus ([place])   . Migraine headache   . Osteoarthritis   . Premature atrial contractions   . Raynaud's phenomenon   . Symptomatic menopausal or female climacteric states    Past Surgical History:  Past Surgical History:  Procedure Laterality Date  . CHOLECYSTECTOMY    . HEMORRHOID SURGERY    . HIP PINNING    . LAPAROSCOPY    . NASAL SINUS SURGERY    . TOTAL KNEE ARTHROPLASTY     left knee  . WRIST SURGERY     Left wrist    Social History:  reports that she has never smoked. She has never used smokeless tobacco. She reports that she does  not drink alcohol or use drugs. Family History:  Family History  Problem Relation Age of Onset  . CVA Mother   . Alzheimer's disease Mother   . Heart disease Father   . Diabetes Father   . Hypertension Father   . Hyperlipidemia Father   . CVA Brother   . Cancer Maternal Aunt        Ovarian Cancer  . Cancer Maternal Uncle        Colon Cancer     HOME MEDICATIONS: Allergies as of 09/15/2019      Reactions   Lyrica [pregabalin]    Phenergan [promethazine]    Prochlorperazine       Medication List       Accurate as of September 15, 2019  1:10 PM. If you have any questions, ask your nurse or  doctor.        aspirin 81 MG tablet Take 81 mg by mouth daily.   atorvastatin 10 MG tablet Commonly known as: LIPITOR Take 1 tab daily.   Belbuca 450 MCG Film Generic drug: Buprenorphine HCl Place 1 patch inside cheek 2 (two) times daily.   celecoxib 200 MG capsule Commonly known as: CELEBREX Take 200 mg by mouth daily.   cholecalciferol 25 MCG (1000 UNIT) tablet Commonly known as: VITAMIN D3 Take 1,000 Units by mouth daily.   Dexcom G6 Receiver Devi 1 each by Does not apply route See admin instructions. For continuous glucose monitoring; E13.9   Dexcom G6 Sensor Misc 1 each by Does not apply route See admin instructions. For use with continuous glucose monitoring system. Change sensor every 10 days; E13.9   Dexcom G6 Transmitter Misc 1 each by Does not apply route See admin instructions. For continuous glucose monitoring; E13.9   docusate sodium 100 MG capsule Commonly known as: COLACE Take 100 mg by mouth 2 (two) times daily.   Emgality 120 MG/ML Soaj Generic drug: Galcanezumab-gnlm Inject into the skin.   estradiol 0.1 MG/24HR patch Commonly known as: Minivelle Place 1 patch (0.1 mg total) onto the skin 2 (two) times a week.   famotidine 40 MG tablet Commonly known as: PEPCID Take 40 mg by mouth daily.   FLUoxetine 40 MG capsule Commonly known as: PROZAC Take 40 mg by mouth daily.   insulin lispro 100 UNIT/ML KwikPen Commonly known as: HumaLOG KwikPen Inject 0.03 mLs (3 Units total) into the skin 3 (three) times daily. What changed: how much to take   Lancets Misc Use daily to check blood sugar  4 times daily Dx E11.9   Lantus SoloStar 100 UNIT/ML Solostar Pen Generic drug: Insulin Glargine Inject 7 Units into the skin daily. What changed: how much to take   loratadine 10 MG tablet Commonly known as: CLARITIN Take 10 mg by mouth daily.   Melatonin 1 MG Tabs Take by mouth.   metoprolol succinate 25 MG 24 hr tablet Commonly known as: TOPROL-XL  TAKE 1 TABLET BY MOUTH EVERY DAY   multivitamin with minerals Tabs tablet Take 1 tablet by mouth daily.   NovoFine 32G X 6 MM Misc Generic drug: Insulin Pen Needle Use with insulin pen   BD Pen Needle Nano 2nd Gen 32G X 4 MM Misc Generic drug: Insulin Pen Needle Use as need for insulin 5 times daily Ell.9   omeprazole 20 MG capsule Commonly known as: PRILOSEC Take 1 capsule (20 mg total) by mouth 2 (two) times daily. What changed: how much to take   ONE TOUCH ULTRA 2 w/Device  Kit Use once daily to check blood sugar..   DX E11.9   OneTouch Ultra test strip Generic drug: glucose blood Use once daily to check blood sugar five times daily.  DXE11.9   oxycodone 5 MG capsule Commonly known as: OXY-IR Take 5 mg by mouth every 4 (four) hours as needed.   progesterone 100 MG capsule Commonly known as: PROMETRIUM TAKE 1 CAPSULE BY MOUTH EVERY DAY   SUMAtriptan 20 MG/ACT nasal spray Commonly known as: IMITREX Place 1 spray into the nose every 2 (two) hours as needed for migraine.        OBJECTIVE:   Vital Signs: BP 118/78 (BP Location: Left Arm, Patient Position: Sitting, Cuff Size: Normal)   Pulse 74   Temp 98.5 F (36.9 C)   Ht _0  (1.575 m)   Wt 135 lb 3.2 oz (61.3 kg)   SpO2 99%   BMI 24.73 kg/m   Wt Readings from Last 3 Encounters:  09/15/19 135 lb 3.2 oz (61.3 kg)  09/03/19 135 lb (61.2 kg)  07/21/19 135 lb 12.8 oz (61.6 kg)     Exam: General: Pt appears well and is in NAD  Lungs: Clear with good BS bilat with no rales, rhonchi, or wheezes  Heart: RRR with normal  Abdomen: Normoactive bowel sounds, soft, nontender, without masses or organomegaly palpable  Extremities: No pretibial edema. No tremor. Normal strength and motion throughout.  Neuro: MS is good with appropriate affect, pt is alert and Ox3    DM foot exam:  05/22/2019  The skin of the feet is intact without sores or ulcerations. The pedal pulses are 2+ on right and 2+ on left. The  sensation is decreased to a screening 5.07, 10 gram monofilament bilaterally - this is attributed to her spinal sx   DATA REVIEWED:  Lab Results  Component Value Date   HGBA1C 7.4 (A) 05/22/2019   HGBA1C 11.1 (H) 02/11/2019   Lab Results  Component Value Date   MICROALBUR <0.7 03/25/2019   LDLCALC 84 02/11/2019   CREATININE 0.70 04/06/2019   Lab Results  Component Value Date   MICRALBCREAT 1.2 03/25/2019     Lab Results  Component Value Date   CHOL 194 02/11/2019   HDL 75.40 02/11/2019   LDLCALC 84 02/11/2019   TRIG 175.0 (H) 02/11/2019   CHOLHDL 3 02/11/2019       Results for KITANA, GAGE (MRN [id number]) as of 05/22/2019 14:05  Ref. Range 04/06/2019 14:43 04/06/2019 15:02  Glutamic Acid Decarb Ab Latest Ref Range: <5 IU/mL >250 (H)   Islet Cell Ab Titer Latest Units: JDF units  1,280 (H)    ASSESSMENT / PLAN / RECOMMENDATIONS:   1) Latent Autoimmune Diabetes Mellitus: - Most recent A1c of 6.3 %. Goal A1c < 7.0%  - She is having less hypoglycemic episodes.  -  Current basal dose is appropriate, will tighten her I:C ratio is below - She is starting an insulin pump tomorrow with the below settings (approximation) - She will be started on 50% of her basal rate tomorrow, in the meantime she will only take 50% of her lantus dose in the morning.    MEDICATIONS:  -  Lantus to take 3 units tomorrow morning  - Humalog: I:C ratio of 1:10 - CF : Humalog (BG - 120/80)    Pump setting   Pump   T-Slim   Settings   Insulin type   Humalog    Basal rate  0000-0000 0.187              I:C ratio       0000-0000  1:10          AIT  4 hrs      Sensitivity       0000  80      Goal       0000  80-120      EDUCATION / INSTRUCTIONS:  BG monitoring instructions: Patient is instructed to check her blood sugars 4 times a day  Call [facility] if: BG persistently < 70 or > 300. . I reviewed the Rule of 15 for the treatment of hypoglycemia in  detail with the patient. Literature supplied.   Thirty minutes was spent with the pt    F/U in 3 weeks    Signed electronically by: Mack Guise, MD  [facility]  [facility] [address] [place], [place] [address] Phone: [phone] FAX: [phone]   CC: Shelda Pal, [address] [place] [address] Phone: [phone]  Fax: [phone]  Return to Endocrinology clinic as below: Future Appointments  Date Time Provider [place]  09/16/2019 10:30 AM [place], Tia Masker, RN [place] NDM

## 2019-09-16 ENCOUNTER — Encounter: Payer: PRIVATE HEALTH INSURANCE | Attending: Family Medicine | Admitting: Nutrition

## 2019-09-16 DIAGNOSIS — E119 Type 2 diabetes mellitus without complications: Secondary | ICD-10-CM | POA: Insufficient documentation

## 2019-09-17 ENCOUNTER — Telehealth: Payer: Self-pay | Admitting: Nutrition

## 2019-09-17 ENCOUNTER — Other Ambulatory Visit: Payer: Self-pay

## 2019-09-17 NOTE — Telephone Encounter (Signed)
D/c kwikpens?

## 2019-09-17 NOTE — Telephone Encounter (Signed)
Patient reported that the alarm went off at 9 AM indicating that her temporary basal rate had ended.  She was talked into turning the control IQ feature on.  We discussed what this ment, and she had no final questions.  She reported that her blood sugar was "good this morning" and she was very pleased.

## 2019-09-17 NOTE — Patient Instructions (Signed)
Read over handouts given. Read pump manual Call if questions, or call office if blood sugars drop low, or remain over 250.

## 2019-09-17 NOTE — Progress Notes (Signed)
We discussed how this pump delivers insulin, and the difference between basal and bolus insulin delivery.  We also discussed how the Dexcom works, and how it links to the Tandem pump.  She reported good understanding of this.  She attached a Dexcom sensor to her outer left arm and we linked it to her pump.   Settings were put in by the patient per Dr. Harvel Ricks orders:  Basal rate:  MN: 0.187u/hr.,  I/C:10,   ISF:80 Target: 110  Timing:  5 hr. She was shown how to give a bolus, how to respond to alerts and alarms and how to start/stop the pump.  She was given handouts on how to do these things, ans well as how to change a cartridge, view the status screen, change pump settings and give and extended bolus.   She filled a cartridge with Humalog insulin using pens, and attached a     infuison set to her upper right abdomen.  Her pump was put in a 50% basal reduction mode until 9AM tomorrow.--which meant that we were not able to put the pump in control IQ. It was explained to her and I told her that I would call her to direct her on how to turn this feature on.  It was stressed that she will have to be vigilant about watching her blood sugars until them, because the pump will not prevent lows or correct highs, until this is turned on.  She reported good understanding of this.   We reviwed all handouts given and she was feeling a little overwhelmed.  I explaine that we will meet next week to review what we had talked about today, but that I would be on call to her for the next 7 days.  She seemed reassured, and had no final questions.

## 2019-09-17 NOTE — Telephone Encounter (Signed)
Please order Humalog insulin in the vials for her.  She will need to be on 33u per day, because her cartridge must be filled to 100u q 3 days.

## 2019-09-17 NOTE — Telephone Encounter (Signed)
Patient reported no difficulty giving bolus for lunch and supper  She reports that she ate Ti food and was not certain of the carbs, and that her blood sugars were continuing to rise.  She was told to do a correction does if blood sugars went over 250 by bedtime.  She reported good understanding of this, and had no other questions for me at this time.

## 2019-09-21 ENCOUNTER — Other Ambulatory Visit: Payer: Self-pay | Admitting: Internal Medicine

## 2019-09-21 MED ORDER — INSULIN LISPRO 100 UNIT/ML ~~LOC~~ SOLN: SUBCUTANEOUS | 11 refills | Status: DC

## 2019-09-22 ENCOUNTER — Other Ambulatory Visit: Payer: Self-pay

## 2019-09-22 ENCOUNTER — Encounter: Payer: PRIVATE HEALTH INSURANCE | Attending: Family Medicine | Admitting: Nutrition

## 2019-09-22 DIAGNOSIS — E119 Type 2 diabetes mellitus without complications: Secondary | ICD-10-CM | POA: Insufficient documentation

## 2019-09-22 DIAGNOSIS — E1065 Type 1 diabetes mellitus with hyperglycemia: Secondary | ICD-10-CM

## 2019-09-24 NOTE — Patient Instructions (Signed)
Review handouts given and pump manual. Call if questions 

## 2019-09-24 NOTE — Progress Notes (Addendum)
Patient reports that she is bolusing withoutt difficulty, and feels a little more confident in what she is doing with her pump.  She did a cartridge change with little assistance from me.  We reviewed all topics on checklist and she reported good understanding of all of them.  We reviewed high blood sugar protocol and when and how to use the extended bolus.   Patient is concerned about her weight gain, and we again discussed the need to limit fat, to help with total calorie intake and her gastroparesis.  We reviewed what is in the fat list of foods and she reported good understanding of this.  She had no final questions. Pump download shows patient is bolusing correctly with a few lows after meals, but she reports that she was not able to eat all of what she had planned due to gastroparesis.  Her pump suspended before low, and she was shown this.  Pump settings have not been changed:  Basal rate: 0.187, I/C: 10, ISF: 80, target 110 (control IQ), timing: 5 hr. (control IQ) Alerts: low: 75,  High: 250.

## 2019-10-01 ENCOUNTER — Other Ambulatory Visit: Payer: Self-pay

## 2019-10-05 ENCOUNTER — Telehealth: Payer: Self-pay

## 2019-10-05 ENCOUNTER — Other Ambulatory Visit: Payer: Self-pay

## 2019-10-05 ENCOUNTER — Ambulatory Visit: Payer: PRIVATE HEALTH INSURANCE | Admitting: Internal Medicine

## 2019-10-05 ENCOUNTER — Encounter: Payer: Self-pay | Admitting: Internal Medicine

## 2019-10-05 VITALS — BP 118/78 | HR 74 | Temp 98.3°F | Ht 62.0 in | Wt 139.2 lb

## 2019-10-05 DIAGNOSIS — R1013 Epigastric pain: Secondary | ICD-10-CM | POA: Diagnosis not present

## 2019-10-05 DIAGNOSIS — E139 Other specified diabetes mellitus without complications: Secondary | ICD-10-CM | POA: Diagnosis not present

## 2019-10-05 LAB — BASIC METABOLIC PANEL
BUN: 17 mg/dL (ref 6–23)
CO2: 34 mEq/L — ABNORMAL HIGH (ref 19–32)
Calcium: 9.4 mg/dL (ref 8.4–10.5)
Chloride: 98 mEq/L (ref 96–112)
Creatinine, Ser: 0.77 mg/dL (ref 0.40–1.20)
GFR: 75.86 mL/min (ref >60.00)
Glucose, Bld: 87 mg/dL (ref 70–99)
Potassium: 4.7 mEq/L (ref 3.5–5.1)
Sodium: 136 mEq/L (ref 135–145)

## 2019-10-05 LAB — GLUCOSE, POCT (MANUAL RESULT ENTRY): POC Glucose: 93 mg/dl (ref 70–99)

## 2019-10-05 NOTE — Progress Notes (Signed)
Name: Taylor [name]  Age/ Sex: 63 y.o., female   MRN/ DOB: [id number], 1956/06/04     PCP: Taylor Pal, DO   Reason for Endocrinology Evaluation: Type 1 Diabetes Mellitus  Initial Endocrine Consultative Visit: 04/06/19    PATIENT IDENTIFIER: Taylor [name] is a 64 y.o. female with a past medical history of T1DM, SLE, GERD, Gastroparesis. The patient has followed with Endocrinology clinic since 04/06/2019 for consultative assistance with management of her diabetes.  DIABETIC HISTORY:  Taylor [name] was diagnosed with T1DM in 01/2019. Her hemoglobin A1c was 11.1% on diagnosis. No DKA. Islet cell Ab's positive, consistent with L ADA. She was initially was put on an insulin mix but due to fluctuating BG's she was changed to an MDI regimen.  She was started on an insulin pump January 2021    Thyroid Nodule History :  She was found to have an incidental MNG on CT scan. A dominant right thyroid nodule of 1.6x0.9x1 cm was performed with benign cytology in 05/2015 Due to concerns of weight loss and feeling poorly a cortisol was checked by her endocrinologist in 05/2018 at 9.8 mcg/dL   SUBJECTIVE:   During the last visit (09/15/2019): Switched from MDI regimen to an insulin pump.   Today (10/05/2019): Taylor [name] is here for a follow up on her diabetes management.She has been on the pump for a few weeks now. She checks her blood sugars multiple times daily. The patient has had hypoglycemic episodes since the last clinic visit, which typically occur less frequently, no more fasting hypoglycemia.   Otherwise, the patient has not required any recent emergency interventions for hypoglycemia and has not had recent hospitalizations secondary to hyper or hypoglycemic episodes.   Upper abdominal pain , started this am, has been intermittent from 6- 11 Am. Has chronic nausea, has chronic constipation but has been improving.   ROS: As per HPI and as detailed below: Review of Systems   Constitutional: Negative for chills and fever.  HENT: Negative for congestion and sore throat.   Respiratory: Negative for cough and shortness of breath.   Cardiovascular: Negative for chest pain and palpitations.  Gastrointestinal: Negative for abdominal pain and nausea.      HOME DIABETES REGIMEN:  [facility] through [facility]   PUMP SETTINGS & DOWNLOAD   BASAL SETTINGS: Time  Basal Rate (units/ hr)   0000 0.187        Total 24-hour basal: 4 units  BOLUS SETTINGS: Insulin:Carb Ratio: 1:10 Sensitivity: 80 Target BG: 80-120 Active Insulin Time: 5 hours  CURRENT PUMP STATISTICS:2/1-2/14/2021 Average BG: 131  BG Readings: 284  Average Daily Carbs (g): 133 Average Daily Basal: 4 (21 %) Average Daily Bolus: 13 (79 %)     CONTINUOUS GLUCOSE MONITORING RECORD INTERPRETATION    Dates of Recording: 2/1-2/14/2021  Sensor description: Dexcom  Results statistics:   CGM use % of time 97  Average and SD 131/32  Time in range    91    %  % Time Above 180 7  % Time above 250 0  % Time Below target 2                 HISTORY:  Past Medical History:  Past Medical History:  Diagnosis Date  . Anxiety   . Colon polyp   . Gastroparesis   . GERD (gastroesophageal reflux disease)   . Hyperlipidemia   . Lupus ([place])   . Migraine headache   . Osteoarthritis   .  Premature atrial contractions   . Raynaud's phenomenon   . Symptomatic menopausal or female climacteric states    Past Surgical History:  Past Surgical History:  Procedure Laterality Date  . CHOLECYSTECTOMY    . HEMORRHOID SURGERY    . HIP PINNING    . LAPAROSCOPY    . NASAL SINUS SURGERY    . TOTAL KNEE ARTHROPLASTY     left knee  . WRIST SURGERY     Left wrist    Social History:  reports that she has never smoked. She has never used smokeless tobacco. She reports that she does not drink alcohol or use drugs. Family History:  Family History  Problem Relation Age of Onset  . CVA Mother   .  Alzheimer's disease Mother   . Heart disease Father   . Diabetes Father   . Hypertension Father   . Hyperlipidemia Father   . CVA Brother   . Cancer Maternal Aunt        Ovarian Cancer  . Cancer Maternal Uncle        Colon Cancer     HOME MEDICATIONS: Allergies as of 10/05/2019      Reactions   Lyrica [pregabalin]    Phenergan [promethazine]    Prochlorperazine       Medication List       Accurate as of October 05, 2019  4:02 PM. If you have any questions, ask your nurse or doctor.        aspirin 81 MG tablet Take 81 mg by mouth daily.   atorvastatin 10 MG tablet Commonly known as: LIPITOR Take 1 tab daily.   Belbuca 450 MCG Film Generic drug: Buprenorphine HCl Place 1 patch inside cheek 2 (two) times daily.   celecoxib 200 MG capsule Commonly known as: CELEBREX Take 200 mg by mouth daily.   cholecalciferol 25 MCG (1000 UNIT) tablet Commonly known as: VITAMIN D3 Take 1,000 Units by mouth daily.   Dexcom G6 Receiver Devi 1 each by Does not apply route See admin instructions. For continuous glucose monitoring; E13.9   Dexcom G6 Sensor Misc 1 each by Does not apply route See admin instructions. For use with continuous glucose monitoring system. Change sensor every 10 days; E13.9   Dexcom G6 Transmitter Misc 1 each by Does not apply route See admin instructions. For continuous glucose monitoring; E13.9   docusate sodium 100 MG capsule Commonly known as: COLACE Take 100 mg by mouth 2 (two) times daily.   Emgality 120 MG/ML Soaj Generic drug: Galcanezumab-gnlm Inject into the skin.   estradiol 0.1 MG/24HR patch Commonly known as: Minivelle Place 1 patch (0.1 mg total) onto the skin 2 (two) times a week.   famotidine 40 MG tablet Commonly known as: PEPCID Take 40 mg by mouth daily.   FLUoxetine 40 MG capsule Commonly known as: PROZAC Take 40 mg by mouth daily.   insulin lispro 100 UNIT/ML injection Commonly known as: HUMALOG Per pump settings    Lancets Misc Use daily to check blood sugar  4 times daily Dx E11.9   loratadine 10 MG tablet Commonly known as: CLARITIN Take 10 mg by mouth daily.   Melatonin 1 MG Tabs Take by mouth.   metoprolol succinate 25 MG 24 hr tablet Commonly known as: TOPROL-XL TAKE 1 TABLET BY MOUTH EVERY DAY   multivitamin with minerals Tabs tablet Take 1 tablet by mouth daily.   NovoFine 32G X 6 MM Misc Generic drug: Insulin Pen Needle Use with insulin pen  BD Pen Needle Nano 2nd Gen 32G X 4 MM Misc Generic drug: Insulin Pen Needle Use as need for insulin 5 times daily Ell.9   omeprazole 20 MG capsule Commonly known as: PRILOSEC Take 1 capsule (20 mg total) by mouth 2 (two) times daily. What changed: how much to take   ONE TOUCH ULTRA 2 w/Device Kit Use once daily to check blood sugar..   DX E11.9   OneTouch Ultra test strip Generic drug: glucose blood Use once daily to check blood sugar five times daily.  DXE11.9   oxycodone 5 MG capsule Commonly known as: OXY-IR Take 5 mg by mouth every 4 (four) hours as needed.   progesterone 100 MG capsule Commonly known as: PROMETRIUM TAKE 1 CAPSULE BY MOUTH EVERY DAY   SUMAtriptan 20 MG/ACT nasal spray Commonly known as: IMITREX Place 1 spray into the nose every 2 (two) hours as needed for migraine.   T:slim Insulin Delivery System Devi by Does not apply route.        OBJECTIVE:   Vital Signs: BP 118/78 (BP Location: Left Arm, Patient Position: Sitting, Cuff Size: Normal)   Pulse 74   Temp 98.3 F (36.8 C)   Ht '5\' 2"'  (1.575 m)   Wt 139 lb 3.2 oz (63.1 kg)   SpO2 99%   BMI 25.46 kg/m   Wt Readings from Last 3 Encounters:  10/05/19 139 lb 3.2 oz (63.1 kg)  09/15/19 135 lb 3.2 oz (61.3 kg)  09/03/19 135 lb (61.2 kg)     Exam: General: Pt appears well and is in NAD  Lungs: Clear with good BS bilat with no rales, rhonchi, or wheezes  Heart: RRR with normal  Abdomen: Guarding and tenderness in the epigastric area   Extremities: No pretibial edema. No tremor.  Neuro: MS is good with appropriate affect, pt is alert and Ox3    DM foot exam:  05/22/2019  The skin of the feet is intact without sores or ulcerations. The pedal pulses are 2+ on right and 2+ on left. The sensation is decreased to a screening 5.07, 10 gram monofilament bilaterally - this is attributed to her spinal sx   DATA REVIEWED:  Lab Results  Component Value Date   HGBA1C 6.3 (A) 09/15/2019   HGBA1C 7.4 (A) 05/22/2019   HGBA1C 11.1 (H) 02/11/2019   Lab Results  Component Value Date   MICROALBUR <0.7 03/25/2019   LDLCALC 84 02/11/2019   CREATININE 0.77 10/05/2019   Lab Results  Component Value Date   MICRALBCREAT 1.2 03/25/2019     Lab Results  Component Value Date   CHOL 194 02/11/2019   HDL 75.40 02/11/2019   LDLCALC 84 02/11/2019   TRIG 175.0 (H) 02/11/2019   CHOLHDL 3 02/11/2019       Results for DRENA, HAM (MRN [id number]) as of 10/05/2019 16:02  Ref. Range 10/05/2019 14:13  Sodium Latest Ref Range: 135 - 145 mEq/L 136  Potassium Latest Ref Range: 3.5 - 5.1 mEq/L 4.7  Chloride Latest Ref Range: 96 - 112 mEq/L 98  CO2 Latest Ref Range: 19 - 32 mEq/L 34 (H)  Glucose Latest Ref Range: 70 - 99 mg/dL 87  BUN Latest Ref Range: 6 - 23 mg/dL 17  Creatinine Latest Ref Range: 0.40 - 1.20 mg/dL 0.77  Calcium Latest Ref Range: 8.4 - 10.5 mg/dL 9.4  GFR Latest Ref Range: >60.00 mL/min 75.86      Results for SALWA, BAI (MRN [id number]) as of 05/22/2019 14:05  Ref. Range 04/06/2019 14:43 04/06/2019 15:02  Glutamic Acid Decarb Ab Latest Ref Range: <5 IU/mL >250 (H)   Islet Cell Ab Titer Latest Units: JDF units  1,280 (H)    ASSESSMENT / PLAN / RECOMMENDATIONS:   1) Latent Autoimmune Diabetes Mellitus: - Most recent A1c of 6.3 %. Goal A1c < 7.0%  - She has been using the pump for a few weeks now, she is adjusting really well to it.  - She has been having hypoglycemic episodes during the day, mainly following a  bolus, but her overall hypoglycemic rate is 2 % which is acceptable. Will adjust basal rate as well as I:C ratio. I am unable to change the active insulin time , as its pre-set in the IQ mode.  - During her office visit she received a CGM alert with a BG of 66 mg.dL She was provided with 5.5 oz of orange juice, when it was checked through fingerstick her Bg was 93 mg/dL. Repeat Bg after 15 minute was 116 ( CGM shows 69) I have advised the pt to confirm out of the ordinary BG's through finger sticks are they are more accurate.  - BMP normal today   MEDICATIONS: Humalog   T-Slim Pump setting   Pump   T-Slim   Settings   Insulin type   Humalog    Basal rate       0000-0000 0.168              I:C ratio       0000-0000  1:12          AIT  5 hrs      Sensitivity       0000  80      Goal       0000  80-120      EDUCATION / INSTRUCTIONS:  BG monitoring instructions: Patient is instructed to check her blood sugars 4 times a day  Call [facility] if: BG persistently < 70 or > 300. . I reviewed the Rule of 15 for the treatment of hypoglycemia in detail with the patient. Literature supplied.   2. Abdominal pain :   - Pt has tenderness at the epigastric area, we discussed gastritis as a differential ,she doesn't believe this is the case, as its different then her usual pain. She is already on PPI. Pt instructed to contact GI  - BMP normal without evidence of DKA   F/U in 3 weeks    Signed electronically by: Mack Guise, MD  [facility]  [facility] [address] [place], [place] [address] Phone: [phone] FAX: [phone]   CC: Taylor [name], [address] [place] [address] Phone: [phone]  Fax: [phone]  Return to Endocrinology clinic as below: Future Appointments  Date Time Provider [place]  10/27/2019  1:20 PM Shamleffer, Melanie Crazier, MD  [facility] None

## 2019-10-05 NOTE — Telephone Encounter (Signed)
Pt stated that she will be out of supplies in 8 days and wanted to know why she has not received anything as of yet?

## 2019-10-05 NOTE — Patient Instructions (Signed)
BASAL SETTINGS: Time  Basal Rate (units/ hr)   0000 0.168   BOLUS SETTINGS: Insulin:Carb Ratio: 1:12 Sensitivity: 80 Target BG: 80-120 Active Insulin Time: 5 hours

## 2019-10-06 LAB — GLUCOSE, POCT (MANUAL RESULT ENTRY): POC Glucose: 116 mg/dl — AB (ref 70–99)

## 2019-10-06 NOTE — Telephone Encounter (Signed)
Patient said she was a little confused, and when she got home she found 3 months of pump supplies.  She requested another appointment with me because she had some questions about the pump.  Appointment scheduled for next week

## 2019-10-06 NOTE — Addendum Note (Signed)
Addended by: Lelon Frohlich D on: 10/06/2019 07:27 AM   Modules accepted: Orders

## 2019-10-06 NOTE — Telephone Encounter (Signed)
Fyi.

## 2019-10-13 ENCOUNTER — Other Ambulatory Visit: Payer: Self-pay

## 2019-10-13 ENCOUNTER — Encounter: Payer: PRIVATE HEALTH INSURANCE | Admitting: Nutrition

## 2019-10-13 DIAGNOSIS — E119 Type 2 diabetes mellitus without complications: Secondary | ICD-10-CM

## 2019-10-15 NOTE — Progress Notes (Signed)
Pump download shows 5% of readings are above the target of 180, and 7% are below target.  89% of readings are in the target of 70-180.  She was praised for this accomplishment and reminded of the many highs and lows she was experiencing just 5 weeks ago.   Questions were answered on responses from the pump when IOB is subtracted from the bolus and/or when signaling that too much IOB is active and may drop her blood sugar low. She did not understand the concept of IOB and this was again explained to her.  She was adding more insulin to the bolus the the pump subtracted the IOB, and this was dropping her blood sugars.   She was told to let the pump calculate the bolus amount for her at all times, because it is smarted than she is at this.  And to just heed warnings that blood sugar may drop low if too much IOB is on board, by watching the blood sugars as the insulin gets depleted.  Since eating higher in fat, most of these times, the extra insulin is used for the high fat meals.and no low blood sugars happen.   All questions were answered to her satisfaction, but she is still upset at her weight.  She was told to keep a 3 day food record with directions on how to do this, and to call and make an appointment with our dietitian.  She agreed to do this.

## 2019-10-15 NOTE — Patient Instructions (Signed)
Keep a food record Call the office and make an appointment with Vernona Rieger, the dietitian

## 2019-10-27 ENCOUNTER — Encounter: Payer: Self-pay | Admitting: Internal Medicine

## 2019-10-27 ENCOUNTER — Ambulatory Visit: Payer: PRIVATE HEALTH INSURANCE | Admitting: Internal Medicine

## 2019-10-27 ENCOUNTER — Other Ambulatory Visit: Payer: Self-pay

## 2019-10-27 VITALS — BP 124/68 | HR 68 | Temp 98.1°F | Ht 62.0 in | Wt 139.8 lb

## 2019-10-27 DIAGNOSIS — E559 Vitamin D deficiency, unspecified: Secondary | ICD-10-CM | POA: Diagnosis not present

## 2019-10-27 DIAGNOSIS — R202 Paresthesia of skin: Secondary | ICD-10-CM | POA: Diagnosis not present

## 2019-10-27 DIAGNOSIS — E139 Other specified diabetes mellitus without complications: Secondary | ICD-10-CM | POA: Diagnosis not present

## 2019-10-27 LAB — BASIC METABOLIC PANEL
BUN: 18 mg/dL (ref 6–23)
CO2: 35 mEq/L — ABNORMAL HIGH (ref 19–32)
Calcium: 9.6 mg/dL (ref 8.4–10.5)
Chloride: 100 mEq/L (ref 96–112)
Creatinine, Ser: 0.75 mg/dL (ref 0.40–1.20)
GFR: 78.19 mL/min (ref >60.00)
Glucose, Bld: 72 mg/dL (ref 70–99)
Potassium: 4.8 mEq/L (ref 3.5–5.1)
Sodium: 138 mEq/L (ref 135–145)

## 2019-10-27 LAB — VITAMIN D 25 HYDROXY (VIT D DEFICIENCY, FRACTURES): VITD: 27.22 ng/mL — ABNORMAL LOW (ref 30.00–100.00)

## 2019-10-27 LAB — VITAMIN B12: Vitamin B-12: 1199 pg/mL — ABNORMAL HIGH (ref 211–911)

## 2019-10-27 NOTE — Patient Instructions (Signed)
Pump   T-Slim   Settings   Insulin type   Humalog    Basal rate       0000-0000 0.136              I:C ratio       0000-0000  1:14          AIT  5 hrs      Sensitivity       0000  80      Goal       0000  80-120

## 2019-10-27 NOTE — Progress Notes (Signed)
Name: Taylor [name]  Age/ Sex: 64 y.o., female   MRN/ DOB: [id number], 1956-04-10     PCP: Shelda Pal, DO   Reason for Endocrinology Evaluation: Type 1 Diabetes Mellitus  Initial Endocrine Consultative Visit: 04/06/19    PATIENT IDENTIFIER: Taylor [name] is a 64 y.o. female with a past medical history of T1DM, SLE, GERD, Gastroparesis. The patient has followed with Endocrinology clinic since 04/06/2019 for consultative assistance with management of her diabetes.  DIABETIC HISTORY:  Taylor [name] was diagnosed with T1DM in 01/2019. Her hemoglobin A1c was 11.1% on diagnosis. No DKA. Islet cell Ab's positive, consistent with L ADA. She was initially was put on an insulin mix but due to fluctuating BG's she was changed to an MDI regimen.  She was started on an insulin pump January 2021    Thyroid Nodule History :  She was found to have an incidental MNG on CT scan. A dominant right thyroid nodule of 1.6x0.9x1 cm was performed with benign cytology in 05/2015 Due to concerns of weight loss and feeling poorly a cortisol was checked by her endocrinologist in 05/2018 at 9.8 mcg/dL   SUBJECTIVE:   During the last visit (10/05/2019): We reduced her basal rate, as well as adjusted insulin to carb ratio   Today (10/27/2019): Taylor [name] is here for a follow up on her diabetes management.  She continues to use the t:slim. She checks her blood sugars multiple times daily. The patient has had hypoglycemic episodes since the last clinic visit, which typically occur during the day following a bolus.   Otherwise, the patient has not required any recent emergency interventions for hypoglycemia and has not had recent hospitalizations secondary to hyper or hypoglycemic episodes.    She is c/o band like pain around the left calf, as well as night time urgency     ROS: As per HPI and as detailed below: Review of Systems  Gastrointestinal: Positive for heartburn. Negative for abdominal pain.        HOME DIABETES REGIMEN:  [facility] through [facility]   PUMP SETTINGS & DOWNLOAD   BASAL SETTINGS: Time  Basal Rate (units/ hr)   0000 0.168        Total 24-hour basal: 2.94  BOLUS SETTINGS: Insulin:Carb Ratio: 1:12 Sensitivity: 80 Target BG: 80-120 Active Insulin Time: 5 hours  CURRENT PUMP STATISTICS:2/1-2/14/2021 Average BG: 129 BG Readings: 277 Average Daily Carbs (g): 163 Average Daily Basal: 3 (19 %) Average Daily Bolus: 13 (81 %)     CONTINUOUS GLUCOSE MONITORING RECORD INTERPRETATION    Dates of Recording: 2/23-10/26/2019  Sensor description: Dexcom  Results statistics:   CGM use % of time 100  Average and SD 131/32  Time in range    87  %  % Time Above 180 9  % Time above 250 0  % Time Below target 3     HISTORY:  Past Medical History:  Past Medical History:  Diagnosis Date  . Anxiety   . Colon polyp   . Gastroparesis   . GERD (gastroesophageal reflux disease)   . Hyperlipidemia   . Lupus ([place])   . Migraine headache   . Osteoarthritis   . Premature atrial contractions   . Raynaud's phenomenon   . Symptomatic menopausal or female climacteric states    Past Surgical History:  Past Surgical History:  Procedure Laterality Date  . CHOLECYSTECTOMY    . HEMORRHOID SURGERY    . HIP PINNING    . LAPAROSCOPY    .  NASAL SINUS SURGERY    . TOTAL KNEE ARTHROPLASTY     left knee  . WRIST SURGERY     Left wrist    Social History:  reports that she has never smoked. She has never used smokeless tobacco. She reports that she does not drink alcohol or use drugs. Family History:  Family History  Problem Relation Age of Onset  . CVA Mother   . Alzheimer's disease Mother   . Heart disease Father   . Diabetes Father   . Hypertension Father   . Hyperlipidemia Father   . CVA Brother   . Cancer Maternal Aunt        Ovarian Cancer  . Cancer Maternal Uncle        Colon Cancer     HOME MEDICATIONS: Allergies as of 10/27/2019      Reactions    Lyrica [pregabalin]    Phenergan [promethazine]    Prochlorperazine       Medication List       Accurate as of October 27, 2019  2:00 PM. If you have any questions, ask your nurse or doctor.        aspirin 81 MG tablet Take 81 mg by mouth daily.   atorvastatin 10 MG tablet Commonly known as: LIPITOR Take 1 tab daily.   Belbuca 450 MCG Film Generic drug: Buprenorphine HCl Place 1 patch inside cheek 2 (two) times daily.   celecoxib 200 MG capsule Commonly known as: CELEBREX Take 200 mg by mouth daily.   cholecalciferol 25 MCG (1000 UNIT) tablet Commonly known as: VITAMIN D3 Take 1,000 Units by mouth daily.   Dexcom G6 Receiver Devi 1 each by Does not apply route See admin instructions. For continuous glucose monitoring; E13.9   Dexcom G6 Sensor Misc 1 each by Does not apply route See admin instructions. For use with continuous glucose monitoring system. Change sensor every 10 days; E13.9   Dexcom G6 Transmitter Misc 1 each by Does not apply route See admin instructions. For continuous glucose monitoring; E13.9   docusate sodium 100 MG capsule Commonly known as: COLACE Take 100 mg by mouth 2 (two) times daily.   Emgality 120 MG/ML Soaj Generic drug: Galcanezumab-gnlm Inject into the skin.   estradiol 0.1 MG/24HR patch Commonly known as: Minivelle Place 1 patch (0.1 mg total) onto the skin 2 (two) times a week.   famotidine 40 MG tablet Commonly known as: PEPCID Take 40 mg by mouth daily.   FLUoxetine 40 MG capsule Commonly known as: PROZAC Take 40 mg by mouth daily.   insulin lispro 100 UNIT/ML injection Commonly known as: HUMALOG Per pump settings   Lancets Misc Use daily to check blood sugar  4 times daily Dx E11.9   loratadine 10 MG tablet Commonly known as: CLARITIN Take 10 mg by mouth daily.   Melatonin 1 MG Tabs Take by mouth.   metoprolol succinate 25 MG 24 hr tablet Commonly known as: TOPROL-XL TAKE 1 TABLET BY MOUTH EVERY DAY     multivitamin with minerals Tabs tablet Take 1 tablet by mouth daily.   NovoFine 32G X 6 MM Misc Generic drug: Insulin Pen Needle Use with insulin pen   BD Pen Needle Nano 2nd Gen 32G X 4 MM Misc Generic drug: Insulin Pen Needle Use as need for insulin 5 times daily Ell.9   omeprazole 20 MG capsule Commonly known as: PRILOSEC Take 1 capsule (20 mg total) by mouth 2 (two) times daily. What changed: how much to  take   ONE TOUCH ULTRA 2 w/Device Kit Use once daily to check blood sugar..   DX E11.9   OneTouch Ultra test strip Generic drug: glucose blood Use once daily to check blood sugar five times daily.  DXE11.9   oxycodone 5 MG capsule Commonly known as: OXY-IR Take 5 mg by mouth every 4 (four) hours as needed.   progesterone 100 MG capsule Commonly known as: PROMETRIUM TAKE 1 CAPSULE BY MOUTH EVERY DAY   SUMAtriptan 20 MG/ACT nasal spray Commonly known as: IMITREX Place 1 spray into the nose every 2 (two) hours as needed for migraine.   T:slim Insulin Delivery System Devi by Does not apply route.        OBJECTIVE:   Vital Signs: BP 124/68 (BP Location: Left Arm, Patient Position: Sitting, Cuff Size: Large)   Pulse 68   Temp 98.1 F (36.7 C)   Ht '5\' 2"'  (1.575 m)   Wt 139 lb 12.8 oz (63.4 kg)   SpO2 97%   BMI 25.57 kg/m   Wt Readings from Last 3 Encounters:  10/27/19 139 lb 12.8 oz (63.4 kg)  10/05/19 139 lb 3.2 oz (63.1 kg)  09/15/19 135 lb 3.2 oz (61.3 kg)     Exam: General: Pt appears well and is in NAD  Lungs: Clear with good BS bilat with no rales, rhonchi, or wheezes  Heart: RRR with normal  Extremities: No pretibial edema.   Neuro: MS is good with appropriate affect, pt is alert and Ox3    DM foot exam:  05/22/2019  The skin of the feet is intact without sores or ulcerations. The pedal pulses are 2+ on right and 2+ on left. The sensation is decreased to a screening 5.07, 10 gram monofilament bilaterally - this is attributed to her spinal  sx   DATA REVIEWED:  Lab Results  Component Value Date   HGBA1C 6.3 (A) 09/15/2019   HGBA1C 7.4 (A) 05/22/2019   HGBA1C 11.1 (H) 02/11/2019      Results for GENESIS, NOVOSAD (MRN [id number]) as of 05/22/2019 14:05  Ref. Range 04/06/2019 14:43 04/06/2019 15:02  Glutamic Acid Decarb Ab Latest Ref Range: <5 IU/mL >250 (H)   Islet Cell Ab Titer Latest Units: JDF units  1,280 (H)    ASSESSMENT / PLAN / RECOMMENDATIONS:   1) Latent Autoimmune Diabetes Mellitus: - Most recent A1c of 6.3 %. Goal A1c < 7.0%  -She continues to do well with the t:slim, she continues to have occasional hypoglycemic episodes mainly during the day following her bolus, she is having to underestimate the amount of carbohydrates that she consumes due to fear of hypoglycemia, we will adjust her pump settings as below      MEDICATIONS: Humalog   T-Slim Pump setting   Pump   T-Slim   Settings   Insulin type   Humalog    Basal rate       0000-0000 0.136              I:C ratio       0000-0000  1:14          AIT  5 hrs      Sensitivity       0000  80      Goal       0000  80-120      EDUCATION / INSTRUCTIONS:  BG monitoring instructions: Patient is instructed to check her blood sugars 4 times a day  Call [facility] if:  BG persistently < 70 or > 300. . I reviewed the Rule of 15 for the treatment of hypoglycemia in detail with the patient. Literature supplied.   2.  Tingling/numbness  -Per patient she has had EMG studies in the past documenting neuropathy secondary to back issues. -V itamin B12 and vitamin D were checked today, B12 is elevated, but vitamin D is a little low, will advise the patient to increase her vitamin D3 to 2000 IU daily   3.  Vitamin D insufficiency  -Increase vitamin D3 2 2000 IU daily     F/U in 8 weeks    Signed electronically by: Mack Guise, MD  [facility]  [facility] [address] [place], [place] [address] Phone: [phone] FAX: [phone]   CC: Shelda Pal, [address] [place] [address] Phone: [phone]  Fax: [phone]  Return to Endocrinology clinic as below: No future appointments.

## 2019-10-28 ENCOUNTER — Encounter: Payer: Self-pay | Admitting: Internal Medicine

## 2019-10-28 DIAGNOSIS — E559 Vitamin D deficiency, unspecified: Secondary | ICD-10-CM | POA: Insufficient documentation

## 2019-12-23 ENCOUNTER — Ambulatory Visit: Payer: PRIVATE HEALTH INSURANCE | Admitting: Internal Medicine

## 2019-12-23 ENCOUNTER — Encounter: Payer: Self-pay | Admitting: Internal Medicine

## 2019-12-23 ENCOUNTER — Other Ambulatory Visit: Payer: Self-pay

## 2019-12-23 VITALS — BP 120/70 | HR 74 | Temp 98.5°F | Ht 62.0 in | Wt 135.8 lb

## 2019-12-23 DIAGNOSIS — E139 Other specified diabetes mellitus without complications: Secondary | ICD-10-CM

## 2019-12-23 LAB — POCT GLYCOSYLATED HEMOGLOBIN (HGB A1C): Hemoglobin A1C: 5.6 % (ref 4.0–5.6)

## 2019-12-23 NOTE — Progress Notes (Signed)
Name: Taylor [name]  Age/ Sex: 64 y.o., female   MRN/ DOB: [id number], September 21, 1955     PCP: Shelda Pal, DO   Reason for Endocrinology Evaluation: Type 1 Diabetes Mellitus  Initial Endocrine Consultative Visit: 04/06/19    PATIENT IDENTIFIER: [name] is a 64 y.o. female with a past medical history of T1DM, SLE, GERD, Gastroparesis. The patient has followed with Endocrinology clinic since 04/06/2019 for consultative assistance with management of her diabetes.  DIABETIC HISTORY:  [name] was diagnosed with T1DM in 01/2019. Her hemoglobin A1c was 11.1% on diagnosis. No DKA. Islet cell Ab's positive, consistent with L ADA. She was initially was put on an insulin mix but due to fluctuating BG's she was changed to an MDI regimen.  She was started on an insulin pump January 2021    Thyroid Nodule History :  She was found to have an incidental MNG on CT scan. A dominant right thyroid nodule of 1.6x0.9x1 cm was performed with benign cytology in 05/2015 Due to concerns of weight loss and feeling poorly a cortisol was checked by her endocrinologist in 05/2018 at 9.8 mcg/dL   SUBJECTIVE:   During the last visit (10/27/2019): We reduced her basal rate, as well as adjusted insulin to carb ratio   Today (12/24/2019): [name] is here for a follow up on her diabetes management.  She continues to use the t:slim. She checks her blood sugars multiple times daily. The patient has had less Hypoglycemic episodes since the last clinic visit, which typically occur during the day.   Otherwise, the patient has not required any recent emergency interventions for hypoglycemia and has not had recent hospitalizations secondary to hyper or hypoglycemic episodes.     HOME DIABETES REGIMEN:  [place] through [facility]   PUMP SETTINGS & DOWNLOAD   BASAL SETTINGS: Time  Basal Rate (units/ hr)   0000 0.136     Total 24-hour basal: 2.47  BOLUS SETTINGS: Insulin:Carb Ratio: 1:14 Sensitivity:  80 Target BG: 80-120 Active Insulin Time: 5 hours  CURRENT PUMP STATISTICS:4/31-12/22/2019 Average BG: 117 Average Daily Carbs (g): 122 Average Daily Basal: 3 (19 %) Average Daily Bolus: 11 (80 %)     CONTINUOUS GLUCOSE MONITORING RECORD INTERPRETATION    Dates of Recording: 4/21-12/22/2019  Sensor description: Dexcom  Results statistics:   CGM use % of time 98  Average and SD 140/   Time in range    81  %  % Time Above 180 16  % Time above 250 0  % Time Below target 3     HISTORY:  Past Medical History:  Past Medical History:  Diagnosis Date  . Anxiety   . Colon polyp   . Gastroparesis   . GERD (gastroesophageal reflux disease)   . Hyperlipidemia   . Lupus ([place])   . Migraine headache   . Osteoarthritis   . Premature atrial contractions   . Raynaud's phenomenon   . Symptomatic menopausal or female climacteric states    Past Surgical History:  Past Surgical History:  Procedure Laterality Date  . CHOLECYSTECTOMY    . HEMORRHOID SURGERY    . HIP PINNING    . LAPAROSCOPY    . NASAL SINUS SURGERY    . TOTAL KNEE ARTHROPLASTY     left knee  . WRIST SURGERY     Left wrist    Social History:  reports that she has never smoked. She has never used smokeless tobacco. She reports that she  does not drink alcohol or use drugs. Family History:  Family History  Problem Relation Age of Onset  . CVA Mother   . Alzheimer's disease Mother   . Heart disease Father   . Diabetes Father   . Hypertension Father   . Hyperlipidemia Father   . CVA Brother   . Cancer Maternal Aunt        Ovarian Cancer  . Cancer Maternal Uncle        Colon Cancer     HOME MEDICATIONS: Allergies as of 12/23/2019      Reactions   Lyrica [pregabalin]    Phenergan [promethazine]    Prochlorperazine       Medication List       Accurate as of Dec 23, 2019 11:59 PM. If you have any questions, ask your nurse or doctor.        aspirin 81 MG tablet Take 81 mg by mouth daily.     atorvastatin 10 MG tablet Commonly known as: LIPITOR Take 1 tab daily.   [place] 450 MCG Film Generic drug: Buprenorphine HCl Place 1 patch inside cheek 2 (two) times daily.   celecoxib 200 MG capsule Commonly known as: CELEBREX Take 200 mg by mouth daily.   cholecalciferol 25 MCG (1000 UNIT) tablet Commonly known as: VITAMIN D3 Take 1,000 Units by mouth daily.   Dexcom G6 Receiver Devi 1 each by Does not apply route See admin instructions. For continuous glucose monitoring; E13.9   Dexcom G6 Sensor Misc 1 each by Does not apply route See admin instructions. For use with continuous glucose monitoring system. Change sensor every 10 days; E13.9   Dexcom G6 Transmitter Misc 1 each by Does not apply route See admin instructions. For continuous glucose monitoring; E13.9   docusate sodium 100 MG capsule Commonly known as: COLACE Take 100 mg by mouth 2 (two) times daily.   Emgality 120 MG/ML Soaj Generic drug: Galcanezumab-gnlm Inject into the skin.   estradiol 0.1 MG/24HR patch Commonly known as: [place] Place 1 patch (0.1 mg total) onto the skin 2 (two) times a week.   famotidine 40 MG tablet Commonly known as: PEPCID Take 40 mg by mouth daily.   FLUoxetine 40 MG capsule Commonly known as: PROZAC Take 40 mg by mouth daily.   insulin lispro 100 UNIT/ML injection Commonly known as: HUMALOG Per pump settings   Lancets Misc Use daily to check blood sugar  4 times daily Dx E11.9   loratadine 10 MG tablet Commonly known as: CLARITIN Take 10 mg by mouth daily.   melatonin 1 MG Tabs tablet Take by mouth.   metoprolol succinate 25 MG 24 hr tablet Commonly known as: TOPROL-XL TAKE 1 TABLET BY MOUTH EVERY DAY   multivitamin with minerals Tabs tablet Take 1 tablet by mouth daily.   NovoFine 32G X 6 MM Misc Generic drug: Insulin Pen Needle Use with insulin pen   BD Pen Needle Nano 2nd Gen 32G X 4 MM Misc Generic drug: Insulin Pen Needle Use as need for  insulin 5 times daily Ell.9   omeprazole 20 MG capsule Commonly known as: PRILOSEC Take 1 capsule (20 mg total) by mouth 2 (two) times daily. What changed: how much to take   ONE TOUCH ULTRA 2 w/Device Kit Use once daily to check blood sugar..   DX E11.9   OneTouch Ultra test strip Generic drug: glucose blood Use once daily to check blood sugar five times daily.  DXE11.9   oxycodone 5 MG capsule  Commonly known as: OXY-IR Take 5 mg by mouth every 4 (four) hours as needed.   progesterone 100 MG capsule Commonly known as: PROMETRIUM TAKE 1 CAPSULE BY MOUTH EVERY DAY   SUMAtriptan 20 MG/ACT nasal spray Commonly known as: IMITREX Place 1 spray into the nose every 2 (two) hours as needed for migraine.   T:slim Insulin Delivery System Devi by Does not apply route.        OBJECTIVE:   Vital Signs: BP 120/70 (BP Location: Right Arm, Patient Position: Sitting, Cuff Size: Normal)   Pulse 74   Temp 98.5 F (36.9 C) (Oral)   Ht '5\' 2"'  (1.575 m)   Wt 135 lb 12.8 oz (61.6 kg)   SpO2 99%   BMI 24.84 kg/m   Wt Readings from Last 3 Encounters:  12/23/19 135 lb 12.8 oz (61.6 kg)  10/27/19 139 lb 12.8 oz (63.4 kg)  10/05/19 139 lb 3.2 oz (63.1 kg)     Exam: General: Pt appears well and is in NAD  Lungs: Clear with good BS bilat with no rales, rhonchi, or wheezes  Heart: RRR with normal  Extremities: No pretibial edema.   Neuro: MS is good with appropriate affect, pt is alert and Ox3    DM foot exam: 12/23/2019  The skin of the feet is intact without sores or ulcerations. The pedal pulses are 2+ on right and 2+ on left. The sensation is decreased to a screening 5.07, 10 gram monofilament bilaterally - this is attributed to her spinal sx    DATA REVIEWED:  Lab Results  Component Value Date   HGBA1C 5.6 12/23/2019   HGBA1C 6.3 (A) 09/15/2019   HGBA1C 7.4 (A) 05/22/2019      Results for ARIAN, MCQUITTY (MRN [id number]) as of 05/22/2019 14:05  Ref. Range 04/06/2019 14:43  04/06/2019 15:02  Glutamic Acid Decarb Ab Latest Ref Range: <5 IU/mL >250 (H)   Islet Cell Ab Titer Latest Units: JDF units  1,280 (H)    ASSESSMENT / PLAN / RECOMMENDATIONS:   1) Latent Autoimmune Diabetes Mellitus: - Most recent A1c of 5.6 %. Goal A1c < 7.0%   - She continues to do well with t-slim use. - Despite low A1c , she is not having frequent hypoglycemia, no hypoglycemia at all through the night , the hypoglycemic episodes that she gets have been during the day and this is most likely due to entering " fake" carbohydrates because she doesn't like her glucose going up during the meal , she feels the pump does not give her a bolus fast enough , so she enters " fake carbohydrates" so she gets extra insulin but unfortunately that ends up in hypoglycemia more times then not. I have discouraged her from this practice due to increase risk of hypoglycemia. She was also assured that temporary post-prandial hyperglycemia is common with diabetes, and  as long as her BG's return to normal with in 4 hrs this is acceptable.  - I have recommended adjusting I:C from 1:14 to 1:12 that way she will get a bit more insulin with meals and will curb her need to enter fake CHO's but she would like to change it to 1:13      MEDICATIONS: Humalog   T-Slim Pump setting   Pump   T-Slim   Settings   Insulin type   Humalog    Basal rate       0000-0000 0.136  I:C ratio       0000-0000  1:13          AIT  5 hrs      Sensitivity       0000  80      Goal       0000  80-120      EDUCATION / INSTRUCTIONS:  BG monitoring instructions: Patient is instructed to check her blood sugars 4 times a day  Call [place] [facility] clinic if: BG persistently < 70 or > 300. . I reviewed the Rule of 15 for the treatment of hypoglycemia in detail with the patient. Literature supplied.     F/U in 3 months    Signed electronically by: Mack Guise, MD  [facility] Endocrinology    [facility] [address] [place], [place] [address] Phone: [phone] FAX: [phone]   CC: Shelda Pal, [address] [place] [address] Phone: [phone]  Fax: [phone]  Return to Endocrinology clinic as below: Future Appointments  Date Time Provider [place]  03/23/2020  1:00 PM Emmagrace Runkel, Melanie Crazier, MD [facility]

## 2019-12-23 NOTE — Patient Instructions (Signed)
Pump   T-Slim   Settings   Insulin type   Humalog    Basal rate       0000-0000 0.136              I:C ratio       0000-0000  1:13          AIT  5 hrs      Sensitivity       0000  80      Goal       0000  80-120

## 2020-01-28 ENCOUNTER — Other Ambulatory Visit: Payer: Self-pay | Admitting: Family Medicine

## 2020-01-28 DIAGNOSIS — E782 Mixed hyperlipidemia: Secondary | ICD-10-CM

## 2020-03-22 ENCOUNTER — Other Ambulatory Visit: Payer: Self-pay

## 2020-03-22 ENCOUNTER — Encounter: Payer: Self-pay | Admitting: Internal Medicine

## 2020-03-22 ENCOUNTER — Ambulatory Visit: Payer: BC Managed Care – PPO | Admitting: Internal Medicine

## 2020-03-22 VITALS — BP 118/78 | HR 90 | Ht 62.0 in | Wt 137.4 lb

## 2020-03-22 DIAGNOSIS — E1065 Type 1 diabetes mellitus with hyperglycemia: Secondary | ICD-10-CM | POA: Diagnosis not present

## 2020-03-22 DIAGNOSIS — Z780 Asymptomatic menopausal state: Secondary | ICD-10-CM

## 2020-03-22 LAB — POCT GLYCOSYLATED HEMOGLOBIN (HGB A1C): Hemoglobin A1C: 6.1 % — AB (ref 4.0–5.6)

## 2020-03-22 MED ORDER — ESTRADIOL 0.05 MG/24HR TD PTTW: 1.0000 | MEDICATED_PATCH | TRANSDERMAL | 0 refills | Status: DC

## 2020-03-22 NOTE — Patient Instructions (Addendum)
-   No changes to the pump - Start smaller dose of estrogen patch , when you are down to 1 patch, call me so I can send the smaller dose - Progesterone 1 tablet on days 1-10 of the month

## 2020-03-22 NOTE — Progress Notes (Signed)
Name: Taylor [name]  Age/ Sex: 64 y.o., female   MRN/ DOB: [id number], [birth year]     PCP: Taylor Pal, DO   Reason for Endocrinology Evaluation: Type 1 Diabetes Mellitus  Initial Endocrine Consultative Visit: 04/06/19    [name] IDENTIFIER: Taylor [name] is a 64 y.o. female with a past medical history of T1DM, SLE, GERD, Gastroparesis. Taylor [name] has followed with Endocrinology clinic since 04/06/2019 for consultative assistance with management of her diabetes.  DIABETIC HISTORY:  Taylor [name] was diagnosed with T1DM in 01/2019. Her hemoglobin A1c was 11.1% on diagnosis. No DKA. Islet cell Ab's positive, consistent with L ADA. She was initially was put on an insulin mix but due to fluctuating BG's she was changed to an MDI regimen.  She was started on an insulin pump January 2021    Thyroid Nodule History :  She was found to have an incidental MNG on CT scan. A dominant right thyroid nodule of 1.6x0.9x1 cm was performed with benign cytology in 05/2015 Due to concerns of weight loss and feeling poorly a cortisol was checked [name] her endocrinologist in 05/2018 at 9.8 mcg/dL      SUBJECTIVE:   During Taylor last visit (12/23/2019): A1c 5.6%. We adjusted I:C ratio   Today (03/22/2020): Taylor [name] is here for a follow up on her diabetes management.  She continues to use Taylor t:slim. She checks her blood sugars multiple times daily. Taylor [name] has had less Hypoglycemic episodes since Taylor last clinic visit, which typically occur during Taylor day.   Otherwise, Taylor [name] has not required any recent emergency interventions for hypoglycemia and has not had recent hospitalizations secondary to hyper or hypoglycemic episodes.  Taylor pt on Hormonal replacement therapy for > 5 yrs. She has been menopausal for ~ 10 yrs. Pt admits to non-compliance with HRT. Pt in favor or weaning off HRT replacement.   She has not been changing Taylor patches twice weekly, but would change it weekly. As for  Taylor progesterone she had been taking it daily at times and forget other times.    HOME DIABETES REGIMEN:  Taylor [name] through [facility]   PUMP SETTINGS & DOWNLOAD   BASAL SETTINGS: Time  Basal Rate (units/ hr)   0000 0.136     Total 24-hour basal: 2.47  BOLUS SETTINGS: Insulin:Carb Ratio: 1:14 Sensitivity: 80 Target BG: 80-120 Active Insulin Time: 5 hours  CURRENT PUMP STATISTICS:7/20-03/21/2020 Average BG: 95 Average Daily Carbs (g): 122 Average Daily Basal: 2.96 (19 %) Average Daily Bolus: 9.45 (61 %)     CONTINUOUS GLUCOSE MONITORING RECORD INTERPRETATION    Dates of Recording:7/21-03/22/2020  [name] description: Taylor  Results statistics:   CGM use % of time 57  Average and SD 149/35   Time in range    810 %  % Time Above 180 19  % Time above 250 <1  % Time Below target <1      HISTORY:  Past Medical History:  Past Medical History:  Diagnosis Date  . Anxiety   . Colon polyp   . Gastroparesis   . GERD (gastroesophageal reflux disease)   . Hyperlipidemia   . Lupus ([place])   . Migraine headache   . Osteoarthritis   . Premature atrial contractions   . Raynaud's phenomenon   . Symptomatic menopausal or female climacteric states    Past Surgical History:  Past Surgical History:  Procedure Laterality Date  . CHOLECYSTECTOMY    . HEMORRHOID SURGERY    .  HIP PINNING    . LAPAROSCOPY    . NASAL SINUS SURGERY    . TOTAL KNEE ARTHROPLASTY     left knee  . WRIST SURGERY     Left wrist    Social History:  reports that she has never smoked. She has never used smokeless tobacco. She reports that she does not drink alcohol and does not use drugs. Family History:  Family History  Problem Relation Age of Onset  . CVA Mother   . Alzheimer's disease Mother   . Heart disease Father   . Diabetes Father   . Hypertension Father   . Hyperlipidemia Father   . CVA Brother   . Cancer Maternal Aunt        Ovarian Cancer  . Cancer Maternal Uncle        Colon Cancer       HOME MEDICATIONS: Allergies as of 03/22/2020      Reactions   Taylor [name] [pregabalin]    Taylor [name] [promethazine]    Taylor [name]       Medication List       Accurate as of March 22, 2020 11:59 PM. If you have any questions, ask your nurse or doctor.        STOP taking these medications   estradiol 0.1 MG/24HR [name] Commonly known as: Taylor [name] Stopped [name]: Taylor Sciara, [name]     TAKE these medications   aspirin 81 MG tablet Take 81 mg [name] mouth daily.   atorvastatin 10 MG tablet Commonly known as: Taylor [name] TAKE 1 TABLET [name] MOUTH DAILY   Taylor [name] 450 MCG Film Generic drug: Taylor [name] Taylor 1 [name] inside cheek 2 (two) times daily.   celecoxib 200 MG capsule Commonly known as: Taylor [name] Take 200 mg [name] mouth daily.   cholecalciferol 25 MCG (1000 UNIT) tablet Commonly known as: Taylor [name] Take 1,000 Units [name] mouth daily.   Taylor [name] Devi 1 each [name] Does not apply route See admin instructions. For continuous glucose monitoring; E13.9   Taylor [name] Misc 1 each [name] Does not apply route See admin instructions. For use with continuous glucose monitoring system. Change [name] every 10 days; E13.9   Taylor [name] Misc 1 each [name] Does not apply route See admin instructions. For continuous glucose monitoring; E13.9   docusate sodium 100 MG capsule Commonly known as: Taylor [name] Take 100 mg [name] mouth 2 (two) times daily.   Taylor [name] Generic drug: Taylor [name] Taylor [name].   estradiol 0.05 MG/24HR [name] Commonly known as: Taylor [name] Taylor [name]. Taylor [name]   famotidine 40 MG tablet Commonly known as: Taylor [name] Take 40 mg [name] mouth daily.   FLUoxetine 40 MG capsule Commonly known as: Taylor [name] Take 40 mg [name] mouth daily.   insulin lispro 100 UNIT/ML  injection Commonly known as: Taylor [name] Per pump settings   Taylor [name] Misc Use daily to check blood sugar  4 times daily Dx E11.9   loratadine 10 MG tablet Commonly known as: Taylor [name] Take 10 mg [name] mouth daily.   melatonin 1 MG Tabs tablet Take [name] mouth.   metoprolol succinate 25 MG 24 hr tablet Commonly known as: TOPROL-XL TAKE 1 TABLET [name] MOUTH EVERY DAY   multivitamin with minerals Tabs tablet Take 1 tablet [name] mouth daily.   NovoFine 32G X 6 MM Misc Generic drug: Insulin Pen  Needle Use with insulin pen   BD Pen Needle Nano 2nd Gen 32G X 4 MM Misc Generic drug: Insulin Pen Needle Use as need for insulin 5 times daily Ell.9   omeprazole 20 MG capsule Commonly known as: PRILOSEC Take 1 capsule (20 mg total) [name] mouth 2 (two) times daily. What changed: how much to take   ONE TOUCH ULTRA 2 w/Device Kit Use once daily to check blood sugar..   DX E11.9   OneTouch Ultra test strip Generic drug: glucose blood Use once daily to check blood sugar five times daily.  DXE11.9   oxycodone 5 MG capsule Commonly known as: OXY-IR Take 5 mg [name] mouth every 4 (four) hours as needed.   progesterone 100 MG capsule Commonly known as: PROMETRIUM TAKE 1 CAPSULE [name] MOUTH EVERY DAY   SUMAtriptan 20 MG/ACT nasal spray Commonly known as: IMITREX Taylor 1 spray into Taylor nose every 2 (two) hours as needed for migraine.   T:slim Insulin Delivery System Devi [name] Does not apply route.        OBJECTIVE:   Vital Signs: BP 118/78 (BP Location: Left Arm, [name] Position: Sitting, Cuff Size: Normal)   Pulse 90   Ht '5\' 2"'  (1.575 m)   Wt 137 lb 6.4 oz (62.3 kg)   SpO2 93%   BMI 25.13 kg/m   Wt Readings from Last 3 Encounters:  03/22/20 137 lb 6.4 oz (62.3 kg)  12/23/19 135 lb 12.8 oz (61.6 kg)  10/27/19 139 lb 12.8 oz (63.4 kg)     Exam: General: Pt appears well and is in NAD  Lungs: Clear with good BS bilat with no rales, rhonchi, or wheezes  Heart: RRR with normal  Extremities: No  pretibial edema.   Neuro: MS is good with appropriate affect, pt is alert and Ox3    DM foot exam: 12/23/2019  Taylor [name] of Taylor feet is intact without sores or ulcerations. Taylor pedal pulses are 2+ on right and 2+ on left. Taylor sensation is decreased to a screening 5.07, 10 gram monofilament bilaterally - this is attributed to her spinal sx    DATA REVIEWED:  Lab Results  Component Value Date   HGBA1C 6.1 (A) 03/22/2020   HGBA1C 5.6 12/23/2019   HGBA1C 6.3 (A) 09/15/2019      Results for GRADY, LUCCI (MRN [id number]) as of 05/22/2019 14:05  Ref. Range 04/06/2019 14:43 04/06/2019 15:02  Glutamic Acid Decarb Ab Latest Ref Range: <5 IU/mL >250 (H)   Islet Cell Ab Titer Latest Units: JDF units  1,280 (H)    ASSESSMENT / PLAN / RECOMMENDATIONS:   1) Latent Autoimmune Diabetes Mellitus: - Most recent A1c of 6.1 %. Goal A1c < 7.0%   - She continues to do well with t-slim use. - She underestimates Taylor amount of CHO due to fear of hypoglycemia, I offered to adjust her I: C ratio  but pt would like to keep it Taylor same for now.     MEDICATIONS: Taylor [name]   T-Slim Pump setting   Pump   T-Slim   Settings   Insulin type   Taylor [name]    Basal rate       0000-0000 0.136              I:C ratio       0000-0000  1:14          AIT  5 hrs      Sensitivity       0000  80  Goal       0000  80-120      EDUCATION / INSTRUCTIONS:  BG monitoring instructions: [name] is instructed to check her blood sugars 4 times a day  Call [facility] [facility] if: BG persistently < 70 or > 300. . I reviewed Taylor Rule of 15 for Taylor treatment of hypoglycemia in detail with Taylor [name]. Literature supplied.  2. Hormonal Replacement Therapy:    - Pt has been non-compliant with HRT intake, we discussed tapering off treatment and she is in agreement of this.  - Will reduce estradiole [name] from 0.1 mg/ to 0.05 to be changed weekly for 4 weeks, then she will call for Taylor next dose down  -  She was also advised to take progesterone 1-10 days of Taylor month and not daily anymore.        F/U in 4 months    Signed electronically [name]: Mack Guise, [name]  [facility] Endocrinology  [facility] [address] [place], [place] [address] Phone: [phone] FAX: [phone]   CC: Taylor [name], [address] [place] [address] Phone: [phone]  Fax: [phone]  Return to Endocrinology clinic as below: Future Appointments  Date Time Provider [place]  07/26/2020  1:00 PM Kamila Broda, Melanie Crazier, [name] [facility] [place]

## 2020-03-23 ENCOUNTER — Ambulatory Visit: Payer: PRIVATE HEALTH INSURANCE | Admitting: Internal Medicine

## 2020-03-23 DIAGNOSIS — Z780 Asymptomatic menopausal state: Secondary | ICD-10-CM | POA: Insufficient documentation

## 2020-03-28 DIAGNOSIS — M797 Fibromyalgia: Secondary | ICD-10-CM | POA: Diagnosis not present

## 2020-03-28 DIAGNOSIS — M961 Postlaminectomy syndrome, not elsewhere classified: Secondary | ICD-10-CM | POA: Diagnosis not present

## 2020-03-28 DIAGNOSIS — M329 Systemic lupus erythematosus, unspecified: Secondary | ICD-10-CM | POA: Diagnosis not present

## 2020-03-28 DIAGNOSIS — M5030 Other cervical disc degeneration, unspecified cervical region: Secondary | ICD-10-CM | POA: Diagnosis not present

## 2020-04-14 ENCOUNTER — Telehealth: Payer: Self-pay

## 2020-04-14 MED ORDER — INSULIN LISPRO 100 UNIT/ML ~~LOC~~ SOLN: SUBCUTANEOUS | 3 refills | Status: DC

## 2020-04-14 NOTE — Telephone Encounter (Signed)
Patient is requesting refill on Humalog. Pt's most recent tandem download suggests that her total daily insulin usage is 15 units.  What would you like her max daily dose to be set at?

## 2020-04-18 ENCOUNTER — Other Ambulatory Visit: Payer: Self-pay

## 2020-04-18 MED ORDER — INSULIN LISPRO 100 UNIT/ML ~~LOC~~ SOLN: SUBCUTANEOUS | 3 refills | Status: DC

## 2020-04-21 ENCOUNTER — Other Ambulatory Visit: Payer: Self-pay

## 2020-04-21 MED ORDER — INSULIN ASPART 100 UNIT/ML ~~LOC~~ SOLN: SUBCUTANEOUS | 2 refills | Status: DC

## 2020-05-20 ENCOUNTER — Other Ambulatory Visit: Payer: Self-pay | Admitting: Internal Medicine

## 2020-05-24 DIAGNOSIS — M961 Postlaminectomy syndrome, not elsewhere classified: Secondary | ICD-10-CM | POA: Diagnosis not present

## 2020-06-10 ENCOUNTER — Other Ambulatory Visit: Payer: Self-pay | Admitting: Family Medicine

## 2020-06-10 DIAGNOSIS — I10 Essential (primary) hypertension: Secondary | ICD-10-CM

## 2020-06-15 DIAGNOSIS — E108 Type 1 diabetes mellitus with unspecified complications: Secondary | ICD-10-CM | POA: Diagnosis not present

## 2020-06-15 DIAGNOSIS — E139 Other specified diabetes mellitus without complications: Secondary | ICD-10-CM | POA: Diagnosis not present

## 2020-06-27 DIAGNOSIS — M797 Fibromyalgia: Secondary | ICD-10-CM | POA: Diagnosis not present

## 2020-06-27 DIAGNOSIS — M5030 Other cervical disc degeneration, unspecified cervical region: Secondary | ICD-10-CM | POA: Diagnosis not present

## 2020-06-27 DIAGNOSIS — M961 Postlaminectomy syndrome, not elsewhere classified: Secondary | ICD-10-CM | POA: Diagnosis not present

## 2020-07-13 LAB — HM DIABETES EYE EXAM

## 2020-07-26 ENCOUNTER — Ambulatory Visit: Payer: BC Managed Care – PPO | Admitting: Internal Medicine

## 2020-07-26 ENCOUNTER — Other Ambulatory Visit: Payer: Self-pay

## 2020-07-26 ENCOUNTER — Encounter: Payer: Self-pay | Admitting: Internal Medicine

## 2020-07-26 DIAGNOSIS — E041 Nontoxic single thyroid nodule: Secondary | ICD-10-CM | POA: Insufficient documentation

## 2020-07-26 DIAGNOSIS — Z79890 Hormone replacement therapy (postmenopausal): Secondary | ICD-10-CM | POA: Diagnosis not present

## 2020-07-26 DIAGNOSIS — E139 Other specified diabetes mellitus without complications: Secondary | ICD-10-CM | POA: Diagnosis not present

## 2020-07-26 DIAGNOSIS — N644 Mastodynia: Secondary | ICD-10-CM

## 2020-07-26 LAB — POCT GLYCOSYLATED HEMOGLOBIN (HGB A1C): Hemoglobin A1C: 6.6 % — AB (ref 4.0–5.6)

## 2020-07-26 MED ORDER — ESTRADIOL 0.05 MG/24HR TD PTWK: 0.0500 mg | MEDICATED_PATCH | TRANSDERMAL | 6 refills | Status: AC

## 2020-07-26 MED ORDER — PROGESTERONE MICRONIZED 100 MG PO CAPS: 100.0000 mg | ORAL_CAPSULE | ORAL | 1 refills | Status: AC

## 2020-07-26 NOTE — Progress Notes (Signed)
Name: Taylor [name]  Age/ Sex: 64 y.o., female   MRN/ DOB: [id number], 15-May-1956     PCP: Shelda Pal, DO   Reason for Endocrinology Evaluation: Type 1 Diabetes Mellitus  Initial Endocrine Consultative Visit: 04/06/19    PATIENT IDENTIFIER: [name] is a 64 y.o. female with a past medical history of T1DM, SLE, GERD, Gastroparesis. The patient has followed with Endocrinology clinic since 04/06/2019 for consultative assistance with management of her diabetes.  DIABETIC HISTORY:  Taylor [name] was diagnosed with T1DM in 01/2019. Her hemoglobin A1c was 11.1% on diagnosis. No DKA. Islet cell Ab's positive, consistent with L ADA. Taylor [name] was initially was put on an insulin mix but due to fluctuating BG's Taylor [name] was changed to an MDI regimen.  Taylor [name] was started on an insulin pump January 2021    Thyroid Nodule History :  Taylor [name] was found to have an incidental MNG on CT scan. A dominant right thyroid nodule of 1.6x0.9x1 cm was performed with benign cytology in 05/2015 Due to concerns of weight loss and feeling poorly a cortisol was checked by her endocrinologist in 05/2018 at 9.8 mcg/dL    HORMONE REPLACEMENT THERAPY: Taylor [name] has been on Hormonal replacement therapy for > 5 yrs. Taylor [name] has been menopausal since ~ the age of 83 .   SUBJECTIVE:   During the last visit (03/22/2020): A1c 6.1%. We adjusted I:C ratio      Today (03/22/2020): Taylor [name] is here for a follow up on her diabetes management.  Taylor [name] continues to use the t:slim. Taylor [name] checks her blood sugars multiple times daily. The patient has had less Hypoglycemic episodes since the last clinic visit, which typically occur during the day.      We have adjusted her HRT on the last visit . Today Taylor [name] tells me Taylor [name] had stopped it ~ 2 months ago and not feeling well. Taylor [name] is c/o hot flashes that has been worsening as well as insomnia and migraine headaches.    Taylor [name] is c/o enlarging and sore breasts, no galactorrhea , not sure this is due  to weight gain        HOME DIABETES REGIMEN:  Humalog through [facility] Estradiole patch ( Climara) 0.05 to be changed weekly Progesterone 100 mg days 1-10 of each month     PUMP SETTINGS & DOWNLOAD   BASAL SETTINGS: Time  Basal Rate (units/ hr)   0000 0.136     Total 24-hour basal: 3.3  BOLUS SETTINGS: Insulin:Carb Ratio: 1:14 Sensitivity: 80 Target BG: 120 Active Insulin Time: 5 hours  CURRENT PUMP STATISTICS:7/20-03/21/2020 Average BG: 93 Average Daily Carbs (g): 122 Average Daily Basal: 3.98(20 %) Average Daily Bolus: 11.84(59 %) Total daily insulin 91.91   DIABETIC COMPLICATIONS: Microvascular complications:    Denies: CKD, neuropathy, retinopathy   Last eye exam: Completed 07/13/2020  Macrovascular complications:    Denies: CAD, PVD, CVA  HISTORY:  Past Medical History:  Past Medical History:  Diagnosis Date  . Anxiety   . Colon polyp   . Gastroparesis   . GERD (gastroesophageal reflux disease)   . Hyperlipidemia   . Lupus ([place])   . Migraine headache   . Osteoarthritis   . Premature atrial contractions   . Raynaud's phenomenon   . Symptomatic menopausal or female climacteric states    Past Surgical History:  Past Surgical History:  Procedure Laterality Date  . CHOLECYSTECTOMY    . HEMORRHOID SURGERY    . HIP PINNING    .  LAPAROSCOPY    . NASAL SINUS SURGERY    . TOTAL KNEE ARTHROPLASTY     left knee  . WRIST SURGERY     Left wrist    Social History:  reports that Taylor [name] has never smoked. Taylor [name] has never used smokeless tobacco. Taylor [name] reports that Taylor [name] does not drink alcohol and does not use drugs. Family History:  Family History  Problem Relation Age of Onset  . CVA Mother   . Alzheimer's disease Mother   . Heart disease Father   . Diabetes Father   . Hypertension Father   . Hyperlipidemia Father   . CVA Brother   . Cancer Maternal Aunt        Ovarian Cancer  . Cancer Maternal Uncle        Colon Cancer     HOME  MEDICATIONS: Allergies as of 07/26/2020      Reactions   Lyrica [pregabalin]    Phenergan [promethazine]    Prochlorperazine       Medication List       Accurate as of July 26, 2020  1:08 PM. If you have any questions, ask your nurse or doctor.        STOP taking these medications   estradiol 0.05 mg/24hr patch Commonly known as: CLIMARA - Dosed in mg/24 hr Stopped by: Dorita Sciara, MD   progesterone 100 MG capsule Commonly known as: PROMETRIUM Stopped by: Dorita Sciara, MD     TAKE these medications   aspirin 81 MG tablet Take 81 mg by mouth daily.   atorvastatin 10 MG tablet Commonly known as: LIPITOR TAKE 1 TABLET BY MOUTH DAILY   Belbuca 450 MCG Film Generic drug: Buprenorphine HCl Place 1 patch inside cheek 2 (two) times daily.   celecoxib 200 MG capsule Commonly known as: CELEBREX Take 200 mg by mouth daily.   cholecalciferol 25 MCG (1000 UNIT) tablet Commonly known as: VITAMIN D3 Take 1,000 Units by mouth daily.   Dexcom G6 Receiver Devi 1 each by Does not apply route See admin instructions. For continuous glucose monitoring; E13.9   Dexcom G6 Sensor Misc 1 each by Does not apply route See admin instructions. For use with continuous glucose monitoring system. Change sensor every 10 days; E13.9   Dexcom G6 Transmitter Misc 1 each by Does not apply route See admin instructions. For continuous glucose monitoring; E13.9   docusate sodium 100 MG capsule Commonly known as: COLACE Take 100 mg by mouth 2 (two) times daily.   Emgality 120 MG/ML Soaj Generic drug: Galcanezumab-gnlm Inject into the skin.   famotidine 40 MG tablet Commonly known as: PEPCID Take 40 mg by mouth daily.   FLUoxetine 40 MG capsule Commonly known as: PROZAC Take 40 mg by mouth daily.   insulin aspart 100 UNIT/ML injection Commonly known as: NovoLOG [place] Max of 20 units daily via insulin pump. (Replaces Humalog b/c insurance will not cover).   Lancets  Misc Use daily to check blood sugar  4 times daily Dx E11.9   loratadine 10 MG tablet Commonly known as: CLARITIN Take 10 mg by mouth daily.   melatonin 1 MG Tabs tablet Take by mouth.   metoprolol succinate 25 MG 24 hr tablet Commonly known as: TOPROL-XL TAKE 1 TABLET BY MOUTH EVERY DAY   multivitamin with minerals Tabs tablet Take 1 tablet by mouth daily.   NovoFine 32G X 6 MM Misc Generic drug: Insulin Pen Needle Use with insulin pen   BD Pen Needle Nano  2nd Gen 32G X 4 MM Misc Generic drug: Insulin Pen Needle Use as need for insulin 5 times daily Ell.9   omeprazole 20 MG capsule Commonly known as: PRILOSEC Take 1 capsule (20 mg total) by mouth 2 (two) times daily. What changed: how much to take   ONE TOUCH ULTRA 2 w/Device Kit Use once daily to check blood sugar..   DX E11.9   OneTouch Ultra test strip Generic drug: glucose blood Use once daily to check blood sugar five times daily.  DXE11.9   oxycodone 5 MG capsule Commonly known as: OXY-IR Take 5 mg by mouth every 4 (four) hours as needed.   SUMAtriptan 20 MG/ACT nasal spray Commonly known as: IMITREX Place 1 spray into the nose every 2 (two) hours as needed for migraine.   T:slim Insulin Delivery System Devi by Does not apply route.        OBJECTIVE:   Vital Signs: BP 126/82   Pulse 94   Ht '5\' 2"'  (1.575 m)   Wt 143 lb 4 oz (65 kg)   SpO2 99%   BMI 26.20 kg/m   Wt Readings from Last 3 Encounters:  07/26/20 143 lb 4 oz (65 kg)  03/22/20 137 lb 6.4 oz (62.3 kg)  12/23/19 135 lb 12.8 oz (61.6 kg)     Exam: General: Taylor [name] appears well and is in NAD  Lungs: Clear with good BS bilat with no rales, rhonchi, or wheezes  Heart: RRR with normal  Extremities: No pretibial edema.   Neuro: MS is good with appropriate affect, Taylor [name] is alert and Ox3    DM foot exam: 12/23/2019  The skin of the feet is intact without sores or ulcerations. The pedal pulses are 2+ on right and 2+ on left. The sensation is  decreased to a screening 5.07, 10 gram monofilament bilaterally - this is attributed to her spinal sx    DATA REVIEWED:  Lab Results  Component Value Date   HGBA1C 6.6 (A) 07/26/2020   HGBA1C 6.1 (A) 03/22/2020   HGBA1C 5.6 12/23/2019    Results for PEYTEN, PUNCHES (MRN [id number]) as of 07/27/2020 14:56  Ref. Range 07/26/2020 13:43  Sodium Latest Ref Range: 135 - 145 mEq/L 137  Potassium Latest Ref Range: 3.5 - 5.1 mEq/L 5.4 (H)  Chloride Latest Ref Range: 96 - 112 mEq/L 96  CO2 Latest Ref Range: 19 - 32 mEq/L 34 (H)  Glucose Latest Ref Range: 70 - 99 mg/dL 210 (H)  BUN Latest Ref Range: 6 - 23 mg/dL 20  Creatinine Latest Ref Range: 0.40 - 1.20 mg/dL 0.83  Calcium Latest Ref Range: 8.4 - 10.5 mg/dL 9.4  GFR Latest Ref Range: >60.00 mL/min 75.15  Total CHOL/HDL Ratio Unknown 3  Cholesterol Latest Ref Range: 0 - 200 mg/dL 220 (H)  HDL Cholesterol Latest Ref Range: >39.00 mg/dL 63.10  LDL (calc) Latest Ref Range: 0 - 99 mg/dL 123 (H)  MICROALB/CREAT RATIO Latest Ref Range: 0.0 - 30.0 mg/g 0.9  NonHDL Unknown 157.19  Triglycerides Latest Ref Range: 0 - 149 mg/dL 173.0 (H)  VLDL Latest Ref Range: 0.0 - 40.0 mg/dL 34.6  Prolactin Latest Units: ng/mL 10.0  TSH Latest Ref Range: 0.35 - 4.50 uIU/mL 0.95  Creatinine,U Latest Units: mg/dL 76.8  Microalb, Ur Latest Ref Range: 0.0 - 1.9 mg/dL <0.7    Results for KYNDLE, SCHLENDER (MRN [id number]) as of 05/22/2019 14:05  Ref. Range 04/06/2019 14:43 04/06/2019 15:02  Glutamic Acid Decarb Ab Latest Ref Range: <5  IU/mL >250 (H)   Islet Cell Ab Titer Latest Units: JDF units  1,280 (H)    ASSESSMENT / PLAN / RECOMMENDATIONS:   1) Latent Autoimmune Diabetes Mellitus: - Most recent A1c of 6.6 %. Goal A1c < 7.0%   - A1c continues to be optimal  - Taylor [name] had a hypoglycemic episode after lunch yesterday. Taylor [name] entered grams for CHO, Taylor [name] received an IQ controlled bolus, but subsequently there was an entry of  3 grams , immediately followed by 10 gram  entries. Taylor [name] does not recall entering 3 grams but also stated Taylor [name] had a notification on the pump and pressed ok. I have advised her to write this message down and talk to our CDE about it but the most likely explanation for hypoglycemia is insulin stacking, no changes today     MEDICATIONS: Humalog   T-Slim Pump setting   Pump   T-Slim   Settings   Insulin type   Humalog    Basal rate       0000-0000 0.136              I:C ratio       0000-0000  1:14          AIT  5 hrs      Sensitivity       0000  80      Goal       0000  80-120      EDUCATION / INSTRUCTIONS:  BG monitoring instructions: Patient is instructed to check her blood sugars 4 times a day  Call [facility] if: BG persistently < 70  . I reviewed the Rule of 15 for the treatment of hypoglycemia in detail with the patient. Literature supplied.  2. Hormonal Replacement Therapy:    - We reduced her estrogen dose on last visit, Taylor [name] was supposed to call to reduce the dose but instead stopped taking the     3. Thyroid Nodule :   - No local neck symptoms  - Taylor [name] is s/p benign FNA of the right dominant thyroid nodule in 2016. Will proceed with thyroid ultrasound     4. Mastalgia :  - Taylor [name] has noted increase in size and soreness, but no galactorrhea , unclear if this is related to weight gain, will check prolactin. Taylor [name] advised to schedule a physical with PCP as Taylor [name] is due to health maintenance including mammogram   5. Dyslipidemia :  - Her LDL has trended up from 84 in 2020 to 123 mg/dL. I am concerned about compliance issues vs increased amount of fat intake.   Will emphasize compliance. If this remains elevated will adjust Atorvastatin 10 mg daily     Medication  Atorvastatin 10 mg daily   F/U in 4 months    Signed electronically by: Mack Guise, MD  [facility]  [address] [place], [place] [address] Phone:  [phone] FAX: [phone]   CC: Shelda Pal, [address] [place] [address] Phone: [phone]  Fax: [phone]  Return to Endocrinology clinic as below: Future Appointments  Date Time Provider [place]  08/04/2020  1:45 PM Garald Balding, MD [facility] None

## 2020-07-26 NOTE — Patient Instructions (Addendum)
-   No changes to the pump - Restart  Climara 0.05 patch  To be changes once a week  - Progesterone 1 tablet on days 1-10 of the month

## 2020-07-27 LAB — LIPID PANEL
Cholesterol: 220 mg/dL — ABNORMAL HIGH (ref 0–200)
HDL: 63.1 mg/dL (ref >39.00)
LDL Cholesterol: 123 mg/dL — ABNORMAL HIGH (ref 0–99)
NonHDL: 157.19
Total CHOL/HDL Ratio: 3
Triglycerides: 173 mg/dL — ABNORMAL HIGH (ref 0.0–149.0)
VLDL: 34.6 mg/dL (ref 0.0–40.0)

## 2020-07-27 LAB — BASIC METABOLIC PANEL
BUN: 20 mg/dL (ref 6–23)
CO2: 34 mEq/L — ABNORMAL HIGH (ref 19–32)
Calcium: 9.4 mg/dL (ref 8.4–10.5)
Chloride: 96 mEq/L (ref 96–112)
Creatinine, Ser: 0.83 mg/dL (ref 0.40–1.20)
GFR: 75.15 mL/min (ref >60.00)
Glucose, Bld: 210 mg/dL — ABNORMAL HIGH (ref 70–99)
Potassium: 5.4 mEq/L — ABNORMAL HIGH (ref 3.5–5.1)
Sodium: 137 mEq/L (ref 135–145)

## 2020-07-27 LAB — MICROALBUMIN / CREATININE URINE RATIO
Creatinine,U: 76.8 mg/dL
Microalb Creat Ratio: 0.9 mg/g (ref 0.0–30.0)
Microalb, Ur: <0.7 mg/dL (ref 0.0–1.9)

## 2020-07-27 LAB — TSH: TSH: 0.95 u[IU]/mL (ref 0.35–4.50)

## 2020-07-27 LAB — PROLACTIN: Prolactin: 10 ng/mL

## 2020-07-28 ENCOUNTER — Other Ambulatory Visit: Payer: Self-pay | Admitting: Family Medicine

## 2020-07-28 ENCOUNTER — Other Ambulatory Visit: Payer: Self-pay | Admitting: Internal Medicine

## 2020-07-28 DIAGNOSIS — E782 Mixed hyperlipidemia: Secondary | ICD-10-CM

## 2020-08-04 ENCOUNTER — Encounter: Payer: Self-pay | Admitting: Orthopaedic Surgery

## 2020-08-04 ENCOUNTER — Ambulatory Visit: Payer: BC Managed Care – PPO | Admitting: Orthopaedic Surgery

## 2020-08-04 VITALS — Ht 62.0 in | Wt 143.0 lb

## 2020-08-04 DIAGNOSIS — M25511 Pain in right shoulder: Secondary | ICD-10-CM

## 2020-08-04 DIAGNOSIS — M25512 Pain in left shoulder: Secondary | ICD-10-CM | POA: Diagnosis not present

## 2020-08-04 DIAGNOSIS — G8929 Other chronic pain: Secondary | ICD-10-CM

## 2020-08-04 MED ORDER — BUPIVACAINE HCL 0.5 % IJ SOLN
2.0000 mL | INTRAMUSCULAR | Status: AC | PRN
  Administered 2020-08-04: 2 mL via INTRA_ARTICULAR

## 2020-08-04 MED ORDER — LIDOCAINE HCL 2 % IJ SOLN
2.0000 mL | INTRAMUSCULAR | Status: AC | PRN
  Administered 2020-08-04: 2 mL

## 2020-08-04 NOTE — Progress Notes (Signed)
Office Visit Note   Patient: Taylor [name]           Date of Birth: 1956-01-28           MRN: [id number] Visit Date: 08/04/2020              Requested by: Sharlene Dory, DO [address] [place],  [place] [address] PCP: Sharlene Dory, DO   Assessment & Plan: Visit Diagnoses:  1. Chronic pain of both shoulders     Plan: Recurrent bilateral shoulder pain.  Initially seen in January 2021 with x-rays revealing a well-healed old left humeral head fracture without any other abnormalities.  She had symptoms of impingement.  She has had cortisone injection which seems to have made a difference.  She is having a bit more trouble on the right side with prior x-rays demonstrating calcific tendinitis.  The last injection in January helped for about a month only to have it slowly recur.  She is on chronic pain management for issues with her back and uses a cane and she feels like the pressure on her right shoulder increases her pain.  She like to have another cortisone injection of the right shoulder and return in several weeks and inject the left shoulder.  She is an insulin-dependent diabetic with a  pump  Follow-Up Instructions: Return in about 3 weeks (around 08/25/2020).   Orders:  Orders Placed This Encounter  Procedures  . Large Joint Inj: R subacromial bursa   No orders of the defined types were placed in this encounter.     Procedures: Large Joint Inj: R subacromial bursa on 08/04/2020 2:04 PM Indications: pain and diagnostic evaluation Details: 25 G 1.5 in needle, anterolateral approach  Arthrogram: No  Medications: 2 mL lidocaine 2 %; 2 mL bupivacaine 0.5 %  2 mL betamethasone injected in the subacromial space right shoulder along with Xylocaine and Marcaine Consent was given by the patient. Immediately prior to procedure a time out was called to verify the correct patient, procedure, equipment, support staff and site/side marked as required.  Patient was prepped and draped in the usual sterile fashion.       Clinical Data: No additional findings.   Subjective: Chief Complaint  Patient presents with  . Left Shoulder - Pain  . Right Shoulder - Pain  Patient presents today for bilateral shoulder pain. She said that both hurt equally the same. She said that they hurt worse with certain movements. She is right hand dominant. She is currently in pain management for chronic issues with her back.  She also is an insulin-dependent diabetic using a pump.  No new injury or trauma.  Having a bit more trouble on the right than the left shoulder similar to the pain that she has had in the past.   HPI  Review of Systems   Objective: Vital Signs: Ht 5\' 2"  (1.575 m)   Wt 143 lb (64.9 kg)   BMI 26.16 kg/m   Physical Exam Constitutional:      Appearance: She is well-developed and well-nourished.  HENT:     Mouth/Throat:     Mouth: Oropharynx is clear and moist.  Eyes:     Extraocular Movements: EOM normal.     Pupils: Pupils are equal, round, and reactive to light.  Pulmonary:     Effort: Pulmonary effort is normal.  Skin:    General: Skin is warm and dry.  Neurological:     Mental  Status: She is alert and oriented to person, place, and time.  Psychiatric:        Mood and Affect: Mood and affect normal.        Behavior: Behavior normal.     Ortho Exam awake alert and oriented x3.  Some mild tenderness over the anterior lateral subacromial region right shoulder but full overhead motion.  Minimally positive impingement testing and extremes of internal and external rotation and even a little bit with abduction.  Biceps intact.  Skin intact.  Good grip and release.  Minimal discomfort with overhead motion of the left shoulder with negative impingement and empty can testing.  No grating or crepitation on the left.  Mild anterior subacromial pain.  No pain over either [facility] joint  Specialty Comments:  No specialty comments  available.  Imaging: No results found.   PMFS History: Patient Active Problem List   Diagnosis Date Noted  . LADA (latent autoimmune diabetes in adults), managed as type 1 ([facility]) 07/26/2020  . Hormone replacement therapy 07/26/2020  . Thyroid nodule 07/26/2020  . Postmenopause 03/23/2020  . Vitamin D insufficiency 10/28/2019  . Tingling in extremities 10/27/2019  . Epigastric pain 10/05/2019  . Bilateral shoulder pain 09/03/2019  . Latent autoimmune diabetes in adults (LADA), managed as type 1 ([facility]) 05/22/2019  . Uncontrolled type 1 diabetes mellitus with hyperglycemia ([facility]CC) 04/28/2019  . Diabetes mellitus type 2 in nonobese (HCC) 02/14/2019  . Chronic nonintractable headache 05/14/2018  . Gait instability 05/14/2018  . Gastroparesis 02/12/2018  . Essential hypertension, benign 04/18/2014  . Irritable bowel syndrome with constipation 11/15/2013  . Loss of weight 11/15/2013  . Acute upper respiratory infections of unspecified site 11/15/2013  . Post menopausal problems 11/15/2013  . GERD (gastroesophageal reflux disease) 03/23/2013  . Cerumen impaction 03/23/2013  . Unspecified constipation 03/23/2013  . Insomnia 03/23/2013  . Mixed hyperlipidemia 03/23/2013  . Palpitations 03/23/2013   Past Medical History:  Diagnosis Date  . Anxiety   . Colon polyp   . Gastroparesis   . GERD (gastroesophageal reflux disease)   . Hyperlipidemia   . Lupus ([facility])   . Migraine headache   . Osteoarthritis   . Premature atrial contractions   . Raynaud's phenomenon   . Symptomatic menopausal or female climacteric states     Family History  Problem Relation Age of Onset  . CVA Mother   . Alzheimer's disease Mother   . Heart disease Father   . Diabetes Father   . Hypertension Father   . Hyperlipidemia Father   . CVA Brother   . Cancer Maternal Aunt        Ovarian Cancer  . Cancer Maternal Uncle        Colon Cancer    Past Surgical History:  Procedure Laterality Date  .  CHOLECYSTECTOMY    . HEMORRHOID SURGERY    . HIP PINNING    . LAPAROSCOPY    . NASAL SINUS SURGERY    . TOTAL KNEE ARTHROPLASTY     left knee  . WRIST SURGERY     Left wrist   Social History   Occupational History  . Occupation: HOMEMAKER  Tobacco Use  . Smoking status: Never Smoker  . Smokeless tobacco: Never Used  Substance and Sexual Activity  . Alcohol use: No  . Drug use: No  . Sexual activity: Not on file

## 2020-08-29 ENCOUNTER — Encounter: Payer: Self-pay | Admitting: Internal Medicine

## 2020-08-29 ENCOUNTER — Other Ambulatory Visit: Payer: Self-pay

## 2020-08-29 DIAGNOSIS — M961 Postlaminectomy syndrome, not elsewhere classified: Secondary | ICD-10-CM | POA: Diagnosis not present

## 2020-08-29 DIAGNOSIS — M5030 Other cervical disc degeneration, unspecified cervical region: Secondary | ICD-10-CM | POA: Diagnosis not present

## 2020-08-29 DIAGNOSIS — M797 Fibromyalgia: Secondary | ICD-10-CM | POA: Diagnosis not present

## 2020-08-29 DIAGNOSIS — M329 Systemic lupus erythematosus, unspecified: Secondary | ICD-10-CM | POA: Diagnosis not present

## 2020-08-29 NOTE — Progress Notes (Signed)
Name: Taylor [name]  Age/ Sex: 65 y.o., female   MRN/ DOB: [id number], 1956/06/21     PCP: Shelda Pal, DO   Reason for Endocrinology Evaluation: Type 1 Diabetes Mellitus  Initial Endocrine Consultative Visit: 04/06/19    PATIENT IDENTIFIER: [name] is a 65 y.o. female with a past medical history of T1DM, SLE, GERD, Gastroparesis. The patient has followed with Endocrinology clinic since 04/06/2019 for consultative assistance with management of her diabetes.      DIABETIC HISTORY:  Taylor [name] was diagnosed with T1DM in 01/2019. Her hemoglobin A1c was 11.1% on diagnosis. No DKA. Islet cell Ab's positive, consistent with L ADA. She was initially was put on an insulin mix but due to fluctuating BG's she was changed to an MDI regimen.  She was started on an insulin pump January 2021    Thyroid Nodule History :  She was found to have an incidental MNG on CT scan. A dominant right thyroid nodule of 1.6x0.9x1 cm was performed with benign cytology in 05/2015 Due to concerns of weight loss and feeling poorly a cortisol was checked by her endocrinologist in 05/2018 at 9.8 mcg/dL    HORMONE REPLACEMENT THERAPY: She has been on Hormonal replacement therapy for > 5 yrs. She has been menopausal since ~ the age of 87 .   SUBJECTIVE:   During the last visit (07/13/2020): A1c 6.6 %. No changes to insulin pump settings.     Today (03/22/2020): Taylor [name] is here for a short term follow up due to vomiting over the weekend that she believes is attributed to her diabetes.   No vomiting in 36 hrs but continues with nausea but no fever.  Left abdominal pain, strong urine smell, but that has resolved yesterday. Has chronic Gi issues in general.      HOME DIABETES REGIMEN:  Humalog through [facility] Estradiole patch ( Climara) 0.05 to be changed weekly Progesterone 100 mg days 1-10 of each month     PUMP SETTINGS & DOWNLOAD   BASAL SETTINGS: Time  Basal Rate (units/  hr)   0000 0.136     Total 24-hour basal: 3.3  BOLUS SETTINGS: Insulin:Carb Ratio: 1:14 Sensitivity: 80 Target BG: 120 Active Insulin Time: 5 hours  CURRENT PUMP STATISTICS:7/20-03/21/2020 Average BG: 161 Average Daily Carbs (g): 101 Average Daily Basal: 3(22 %) Average Daily Bolus: 11.0 (78 %) Total daily insulin 76.54   DIABETIC COMPLICATIONS: Microvascular complications:    Denies: CKD, neuropathy, retinopathy   Last eye exam: Completed 07/13/2020  Macrovascular complications:    Denies: CAD, PVD, CVA  HISTORY:  Past Medical History:  Past Medical History:  Diagnosis Date   Anxiety    Colon polyp    Gastroparesis    GERD (gastroesophageal reflux disease)    Hyperlipidemia    Lupus (HCC)    Migraine headache    Osteoarthritis    Premature atrial contractions    Raynaud's phenomenon    Symptomatic menopausal or female climacteric states    Past Surgical History:  Past Surgical History:  Procedure Laterality Date   CHOLECYSTECTOMY     HEMORRHOID SURGERY     HIP PINNING     LAPAROSCOPY     NASAL SINUS SURGERY     TOTAL KNEE ARTHROPLASTY     left knee   WRIST SURGERY     Left wrist    Social History:  reports that she has never smoked. She has never used smokeless tobacco. She reports  that she does not drink alcohol and does not use drugs. Family History:  Family History  Problem Relation Age of Onset   CVA Mother    Alzheimer's disease Mother    Heart disease Father    Diabetes Father    Hypertension Father    Hyperlipidemia Father    CVA Brother    Cancer Maternal Aunt        Ovarian Cancer   Cancer Maternal Uncle        Colon Cancer     HOME MEDICATIONS: Allergies as of 08/30/2020      Reactions   Lyrica [pregabalin]    Phenergan [promethazine]    Prochlorperazine       Medication List       Accurate as of August 30, 2020  2:10 PM. If you have any questions, ask your nurse or doctor.         aspirin 81 MG tablet Take 81 mg by mouth daily.   atorvastatin 10 MG tablet Commonly known as: LIPITOR TAKE 1 TABLET BY MOUTH DAILY   Buprenorphine HCl 450 MCG Film Place 1 patch inside cheek 2 (two) times daily.   celecoxib 200 MG capsule Commonly known as: CELEBREX Take 200 mg by mouth daily.   cholecalciferol 25 MCG (1000 UNIT) tablet Commonly known as: VITAMIN D3 Take 1,000 Units by mouth daily.   Dexcom G6 Receiver Devi 1 each by Does not apply route See admin instructions. For continuous glucose monitoring; E13.9   Dexcom G6 Sensor Misc 1 each by Does not apply route See admin instructions. For use with continuous glucose monitoring system. Change sensor every 10 days; E13.9   Dexcom G6 Transmitter Misc 1 each by Does not apply route See admin instructions. For continuous glucose monitoring; E13.9   docusate sodium 100 MG capsule Commonly known as: COLACE Take 100 mg by mouth 2 (two) times daily.   estradiol 0.05 mg/24hr patch Commonly known as: Climara Place 1 patch (0.05 mg total) onto the skin once a week.   famotidine 40 MG tablet Commonly known as: PEPCID Take 40 mg by mouth daily.   FLUoxetine 40 MG capsule Commonly known as: PROZAC Take 40 mg by mouth daily.   Galcanezumab-gnlm 120 MG/ML Soaj Inject into the skin.   insulin aspart 100 UNIT/ML injection Commonly known as: NovoLOG [place] Max of 20 units daily via insulin pump. (Replaces Humalog b/c insurance will not cover).   Lancets Misc Use daily to check blood sugar  4 times daily Dx E11.9   loratadine 10 MG tablet Commonly known as: CLARITIN Take 10 mg by mouth daily.   melatonin 1 MG Tabs tablet Take by mouth.   metoprolol succinate 25 MG 24 hr tablet Commonly known as: TOPROL-XL TAKE 1 TABLET BY MOUTH EVERY DAY   multivitamin with minerals Tabs tablet Take 1 tablet by mouth daily.   NovoFine 32G X 6 MM Misc Generic drug: Insulin Pen Needle Use with insulin pen   BD Pen Needle Nano  2nd Gen 32G X 4 MM Misc Generic drug: Insulin Pen Needle Use as need for insulin 5 times daily Ell.9   omeprazole 20 MG capsule Commonly known as: PRILOSEC Take 1 capsule (20 mg total) by mouth 2 (two) times daily. What changed: how much to take   ONE TOUCH ULTRA 2 w/Device Kit Use once daily to check blood sugar..   DX E11.9   OneTouch Ultra test strip Generic drug: glucose blood Use once daily to check blood sugar  five times daily.  DXE11.9   oxycodone 5 MG capsule Commonly known as: OXY-IR Take 5 mg by mouth every 4 (four) hours as needed.   progesterone 100 MG capsule Commonly known as: PROMETRIUM Take 1 capsule (100 mg total) by mouth as directed.   SUMAtriptan 20 MG/ACT nasal spray Commonly known as: IMITREX Place 1 spray into the nose every 2 (two) hours as needed for migraine.   T:slim Insulin Delivery System Devi by Does not apply route.        OBJECTIVE:   Vital Signs: BP 124/80    Pulse 80    Ht '5\' 2"'  (1.575 m)    Wt 144 lb 8 oz (65.5 kg)    SpO2 98%    BMI 26.43 kg/m   Wt Readings from Last 3 Encounters:  08/30/20 144 lb 8 oz (65.5 kg)  08/04/20 143 lb (64.9 kg)  07/26/20 143 lb 4 oz (65 kg)     Exam: General: Pt appears well and is in NAD  Lungs: Clear with good BS bilat with no rales, rhonchi, or wheezes  Heart: RRR with normal  Abdomen: Epigastric tenderness and guarding noted   Extremities: No pretibial edema.   Neuro: MS is good with appropriate affect, pt is alert and Ox3    DM foot exam: 12/23/2019  The skin of the feet is intact without sores or ulcerations. The pedal pulses are 2+ on right and 2+ on left. The sensation is decreased to a screening 5.07, 10 gram monofilament bilaterally - this is attributed to her spinal sx    DATA REVIEWED:  Lab Results  Component Value Date   HGBA1C 6.6 (A) 07/26/2020   HGBA1C 6.1 (A) 03/22/2020   HGBA1C 5.6 12/23/2019     Results for Taylor [name], Taylor [name] (MRN [id number]) as of 08/31/2020 08:48  Ref.  Range 08/30/2020 11:14  Sodium Latest Ref Range: 135 - 145 mEq/L 136  Potassium Latest Ref Range: 3.5 - 5.1 mEq/L 4.7  Chloride Latest Ref Range: 96 - 112 mEq/L 97  CO2 Latest Ref Range: 19 - 32 mEq/L 35 (H)  Glucose Latest Ref Range: 70 - 99 mg/dL 189 (H)  BUN Latest Ref Range: 6 - 23 mg/dL 12  Creatinine Latest Ref Range: 0.40 - 1.20 mg/dL 0.75  Calcium Latest Ref Range: 8.4 - 10.5 mg/dL 9.1  GFR Latest Ref Range: >60.00 mL/min 84.81  URINALYSIS, ROUTINE W REFLEX MICROSCOPIC Unknown Rpt (A)  Appearance Latest Ref Range: Clear;Turbid;Slightly Cloudy;Cloudy  Sl Cloudy (A)  Bilirubin Urine Latest Ref Range: Negative  NEGATIVE  Color, Urine Latest Ref Range: Yellow;Lt. Yellow;Straw;Dark Yellow;Amber;Green;Red;Brown  YELLOW  Hgb urine dipstick Latest Ref Range: Negative  TRACE-LYSED (A)  Ketones, ur Latest Ref Range: Negative  NEGATIVE  Leukocytes,Ua Latest Ref Range: Negative  NEGATIVE  Nitrite Latest Ref Range: Negative  NEGATIVE  pH Latest Ref Range: 5.0 - 8.0  6.0  Specific Gravity, Urine Latest Ref Range: 1.000 - 1.030  1.020  Urine Glucose Latest Ref Range: Negative  NEGATIVE  Urobilinogen, UA Latest Ref Range: 0.0 - 1.0  0.2  Bacteria, UA Latest Ref Range: None  Few(10-50/hpf) (A)  Ca Oxalate Crys, UA Latest Ref Range: None  Presence of (A)  Mucus, UA Latest Ref Range: None  Presence of (A)  RBC / HPF Latest Ref Range: 0-2/hpf  0-2/hpf  Squamous Epithelial / LPF Latest Ref Range: Rare(0-4/hpf)  Rare(0-4/hpf)  WBC, UA Latest Ref Range: 0-2/hpf  0-2/hpf  Total Protein, Urine-UPE24 Latest Ref Range: Negative  NEGATIVE    Results for Taylor [name], Taylor [name] (MRN [id number]) as of 05/22/2019 14:05  Ref. Range 04/06/2019 14:43 04/06/2019 15:02  Glutamic Acid Decarb Ab Latest Ref Range: <5 IU/mL >250 (H)   Islet Cell Ab Titer Latest Units: JDF units  1,280 (H)    ASSESSMENT / PLAN / RECOMMENDATIONS:   1) Latent Autoimmune Diabetes Mellitus: - Most recent A1c of 6.6 %. Goal A1c < 7.0%   - A1c  continues to be optimal  - In review of the pump setting her glucose does increase following a meal but within a couple hours, it trends down to optimal levels without hypoglycemia.  - Taylor [name] is sensitive to insulin as well as carbohydrates , which is not uncommon in the setting of autoimmune diabetes, but also makes it challenging in optimizing her glucose control, she also has gastroparesis and at times, she has to bolus halfway through the meal or after the meal to prevent the development of hypoglycemia incase she vomits, which again would cause hyperglycemia until the insulin starts to work.  - Will try and adjust her I: C ratio as below    MEDICATIONS: Humalog   T-Slim Pump setting   Pump   T-Slim   Settings   Insulin type   Humalog    Basal rate       0000-0000 0.136              I:C ratio       0000-0000  1:13          AIT  5 hrs      Sensitivity       0000  80      Goal       0000  80-120      EDUCATION / INSTRUCTIONS:  BG monitoring instructions: Patient is instructed to check her blood sugars 4 times a day  Call [facility] if: BG persistently < 70   I reviewed the Rule of 15 for the treatment of hypoglycemia in detail with the patient. Literature supplied.     2) Nausea/ Vomiting:   - Pt is concerned about DKA. As she states she knows her body and knows this is NOT the  Flu, COVID infection, UTI , nor gastroparesis.  - BMP is normal on today's labs - UA  Negative for nitrites and WBC, but has bacteria most likely dirty sample - Clinically she is improving , I emphasized the importance of hydration which she believes she is properly hydrated.     F/U in 4 months    Signed electronically by: Mack Guise, MD  [facility] Endocrinology  [facility] [address] [place], [place] [address] Phone: [phone] FAX: [phone]   CC: Shelda Pal, [address] [place]  [address] Phone: [phone]  Fax: [phone]  Return to Endocrinology clinic as below: Future Appointments  Date Time Provider [place]  09/06/2020  2:00 PM Garald Balding, MD [facility] None  11/22/2020  1:20 PM Rylah Fukuda, Melanie Crazier, MD [facility] [place]

## 2020-08-30 ENCOUNTER — Ambulatory Visit (INDEPENDENT_AMBULATORY_CARE_PROVIDER_SITE_OTHER): Payer: BC Managed Care – PPO | Admitting: Internal Medicine

## 2020-08-30 ENCOUNTER — Encounter: Payer: Self-pay | Admitting: Internal Medicine

## 2020-08-30 VITALS — BP 124/80 | HR 80 | Ht 62.0 in | Wt 144.5 lb

## 2020-08-30 DIAGNOSIS — R112 Nausea with vomiting, unspecified: Secondary | ICD-10-CM | POA: Diagnosis not present

## 2020-08-30 LAB — BASIC METABOLIC PANEL
BUN: 12 mg/dL (ref 6–23)
CO2: 35 mEq/L — ABNORMAL HIGH (ref 19–32)
Calcium: 9.1 mg/dL (ref 8.4–10.5)
Chloride: 97 mEq/L (ref 96–112)
Creatinine, Ser: 0.75 mg/dL (ref 0.40–1.20)
GFR: 84.81 mL/min (ref >60.00)
Glucose, Bld: 189 mg/dL — ABNORMAL HIGH (ref 70–99)
Potassium: 4.7 mEq/L (ref 3.5–5.1)
Sodium: 136 mEq/L (ref 135–145)

## 2020-08-30 LAB — URINALYSIS, ROUTINE W REFLEX MICROSCOPIC
Bilirubin Urine: NEGATIVE
Ketones, ur: NEGATIVE
Leukocytes,Ua: NEGATIVE
Nitrite: NEGATIVE
Specific Gravity, Urine: 1.02 (ref 1.000–1.030)
Total Protein, Urine: NEGATIVE
Urine Glucose: NEGATIVE
Urobilinogen, UA: 0.2 (ref 0.0–1.0)
pH: 6 (ref 5.0–8.0)

## 2020-08-30 NOTE — Patient Instructions (Signed)
Please stop by the lab

## 2020-09-01 ENCOUNTER — Telehealth: Payer: Self-pay | Admitting: Internal Medicine

## 2020-09-01 NOTE — Telephone Encounter (Signed)
Can you please call the pt and see if her symptoms have resolved ?  If not, let her know I would like to order a CT scan of the abdomen to make sure she has no stones.   Also, make sure she recived my message about changing her pump insulin to carb ratio from 14 to 13    Thanks   Abby Raelyn Mora, MD  [facility] Endocrinology  [facility] [address] [place], [place] [address] Phone: [phone] FAX: [phone]

## 2020-09-01 NOTE — Telephone Encounter (Signed)
Per DPR, left detail message for patient of Dr [facility] comments to call back

## 2020-09-06 ENCOUNTER — Ambulatory Visit: Payer: BC Managed Care – PPO | Admitting: Orthopaedic Surgery

## 2020-09-07 DIAGNOSIS — E108 Type 1 diabetes mellitus with unspecified complications: Secondary | ICD-10-CM | POA: Diagnosis not present

## 2020-09-07 DIAGNOSIS — E139 Other specified diabetes mellitus without complications: Secondary | ICD-10-CM | POA: Diagnosis not present

## 2020-09-08 NOTE — Telephone Encounter (Signed)
Patient advises that she is getting a new pump and needs assistance with programming.  Please call at [phone]

## 2020-09-08 NOTE — Telephone Encounter (Signed)
I have spoken to patient and she stated that she will use her old pump in the meantime. However, patient forgot how much insulin she should inject if the pump stop working. Please advise

## 2020-09-08 NOTE — Telephone Encounter (Signed)
Please advise. Can this be forward this to [facility], correct?

## 2020-09-08 NOTE — Telephone Encounter (Signed)
Yes, to Cristy Folks  but she is not working today.    Thanks

## 2020-09-09 ENCOUNTER — Encounter: Payer: Self-pay | Admitting: Internal Medicine

## 2020-09-09 NOTE — Telephone Encounter (Signed)
If the pump stops working she will need 4 units of lantus daily and humalog one unit for every 13 grams of carbs   Please refill lantus if she doesn;t have any     Thanks

## 2020-09-09 NOTE — Telephone Encounter (Signed)
Spoken to patient and notified Dr Harvel Ricks comments.  Patient requested this to be sent through MyChart.  Also will forward to message to [place].

## 2020-09-12 ENCOUNTER — Encounter: Payer: BC Managed Care – PPO | Attending: Internal Medicine | Admitting: Nutrition

## 2020-09-12 ENCOUNTER — Other Ambulatory Visit: Payer: Self-pay

## 2020-09-12 DIAGNOSIS — E139 Other specified diabetes mellitus without complications: Secondary | ICD-10-CM

## 2020-09-12 NOTE — Progress Notes (Signed)
Taylor [name] is here because her pump was not charging appropriately, and she needed to have this new pump set up.  Settings were transferred from her old pump, and was linked to her dexcom and [employer] account.  She filled a cartridge and attached an infusion set and started her new pump.  She had not been giving herself the fill canula amount of 0.3 units whenever she inserted a new infusion set.  We discussed this and she reported good understanding of the need to do this.  She admits that her blood sugars are "all over the place and admits that she does not give herself the total bolus in case she can not finish her meal.  She is also complaining of gaining weight. She was advised to make an appointment at the checkout, and I would be happy to see her for this, as well as her blood sugar fluctuations .  She agreed to do this.

## 2020-09-12 NOTE — Telephone Encounter (Signed)
Pt. Coming in today to set up new pump.She was reminded to bring her phone, insulin and new infusion set and cartridge.  She agreed to do this.

## 2020-09-13 ENCOUNTER — Ambulatory Visit: Payer: BC Managed Care – PPO | Admitting: Orthopaedic Surgery

## 2020-09-16 DIAGNOSIS — Z20822 Contact with and (suspected) exposure to covid-19: Secondary | ICD-10-CM | POA: Diagnosis not present

## 2020-09-19 DIAGNOSIS — E139 Other specified diabetes mellitus without complications: Secondary | ICD-10-CM | POA: Diagnosis not present

## 2020-09-19 DIAGNOSIS — E108 Type 1 diabetes mellitus with unspecified complications: Secondary | ICD-10-CM | POA: Diagnosis not present

## 2020-09-20 ENCOUNTER — Ambulatory Visit: Payer: BC Managed Care – PPO | Admitting: Orthopaedic Surgery

## 2020-09-27 DIAGNOSIS — G2581 Restless legs syndrome: Secondary | ICD-10-CM | POA: Diagnosis not present

## 2020-09-27 DIAGNOSIS — G43109 Migraine with aura, not intractable, without status migrainosus: Secondary | ICD-10-CM | POA: Diagnosis not present

## 2020-09-27 DIAGNOSIS — M545 Low back pain, unspecified: Secondary | ICD-10-CM | POA: Diagnosis not present

## 2020-09-27 DIAGNOSIS — S0990XD Unspecified injury of head, subsequent encounter: Secondary | ICD-10-CM | POA: Diagnosis not present

## 2020-09-28 ENCOUNTER — Encounter: Payer: BC Managed Care – PPO | Admitting: Family Medicine

## 2020-10-04 ENCOUNTER — Encounter: Payer: Self-pay | Admitting: Family Medicine

## 2020-10-04 ENCOUNTER — Ambulatory Visit (INDEPENDENT_AMBULATORY_CARE_PROVIDER_SITE_OTHER): Payer: BC Managed Care – PPO | Admitting: Family Medicine

## 2020-10-04 ENCOUNTER — Other Ambulatory Visit: Payer: Self-pay

## 2020-10-04 VITALS — BP 130/80 | HR 79 | Temp 98.5°F | Ht 62.0 in | Wt 143.4 lb

## 2020-10-04 DIAGNOSIS — L570 Actinic keratosis: Secondary | ICD-10-CM

## 2020-10-04 DIAGNOSIS — Z114 Encounter for screening for human immunodeficiency virus [HIV]: Secondary | ICD-10-CM | POA: Diagnosis not present

## 2020-10-04 DIAGNOSIS — E782 Mixed hyperlipidemia: Secondary | ICD-10-CM

## 2020-10-04 DIAGNOSIS — Z1331 Encounter for screening for depression: Secondary | ICD-10-CM

## 2020-10-04 DIAGNOSIS — Z1231 Encounter for screening mammogram for malignant neoplasm of breast: Secondary | ICD-10-CM

## 2020-10-04 DIAGNOSIS — Z0000 Encounter for general adult medical examination without abnormal findings: Secondary | ICD-10-CM

## 2020-10-04 MED ORDER — ATORVASTATIN CALCIUM 10 MG PO TABS: 10.0000 mg | ORAL_TABLET | Freq: Every day | ORAL | 2 refills | Status: DC

## 2020-10-04 NOTE — Patient Instructions (Addendum)
Give [place] 2-3 business days to get the results of your labs back.   Keep the diet clean and stay active.  Aim to do some physical exertion for 150 minutes per week. This is typically divided into 5 days per week, 30 minutes per day. The activity should be enough to get your heart rate up. Anything is better than nothing if you have time constraints. Consider weight loss.   Let us know if you need anything.

## 2020-10-04 NOTE — Progress Notes (Signed)
Chief Complaint  Patient presents with  . Annual Exam     Well Woman Taylor [name] is here for a complete physical.   Her last physical was >1 year ago.  Current diet: in general, a "healthy" diet. Current exercise: None. Weight is increasing and she denies fatigue out of ordinary. Seatbelt? Yes Loss of interested in doing things or depression in past 2 weeks? No  Health Maintenance Pap/HPV- due; GYN appt next week Mammogram- No Colon cancer screening-Yes Shingrix- No Tetanus- Yes Hep C screening- Yes HIV screening- No   The patient has had 6 months of a scaly lesion on the left side of her head on her scalp.  It does not bleed or seem to bother her.  Her physician husband saw and is concerned.  No new topicals.  Past Medical History:  Diagnosis Date  . Anxiety   . Colon polyp   . Gastroparesis   . GERD (gastroesophageal reflux disease)   . Hyperlipidemia   . Lupus ([place])   . Migraine headache   . Osteoarthritis   . Premature atrial contractions   . Raynaud's phenomenon   . Symptomatic menopausal or female climacteric states      Past Surgical History:  Procedure Laterality Date  . CHOLECYSTECTOMY    . HEMORRHOID SURGERY    . HIP PINNING    . LAPAROSCOPY    . NASAL SINUS SURGERY    . TOTAL KNEE ARTHROPLASTY     left knee  . WRIST SURGERY     Left wrist    Medications  Current Outpatient Medications on File Prior to Visit  Medication Sig Dispense Refill  . aspirin 81 MG tablet Take 81 mg by mouth daily.    . Blood Glucose Monitoring Suppl (ONE TOUCH ULTRA 2) w/Device KIT Use once daily to check blood sugar..   DX E11.9 1 kit 0  . Buprenorphine HCl 450 MCG FILM Place 1 patch inside cheek 2 (two) times daily.    . celecoxib (CELEBREX) 200 MG capsule Take 200 mg by mouth daily.    . cholecalciferol (VITAMIN D3) 25 MCG (1000 UT) tablet Take 1,000 Units by mouth daily.    . Continuous Blood Gluc Receiver ([place]) [place] 1 each by Does not apply route  See admin instructions. For continuous glucose monitoring; E13.9 1 each 0  . Continuous Blood Gluc Sensor (DEXCOM G6 SENSOR) MISC 1 each by Does not apply route See admin instructions. For use with continuous glucose monitoring system. Change sensor every 10 days; E13.9 3 each 2  . Continuous Blood Gluc Transmit (DEXCOM G6 TRANSMITTER) MISC 1 each by Does not apply route See admin instructions. For continuous glucose monitoring; E13.9 1 each 2  . docusate sodium (COLACE) 100 MG capsule Take 100 mg by mouth 2 (two) times daily.    Marland Kitchen estradiol (CLIMARA) 0.05 mg/24hr patch Place 1 patch (0.05 mg total) onto the skin once a week. 4 patch 6  . famotidine (PEPCID) 40 MG tablet Take 40 mg by mouth daily.    Marland Kitchen FLUoxetine (PROZAC) 40 MG capsule Take 40 mg by mouth daily.    . Galcanezumab-gnlm 120 MG/ML SOAJ Inject into the skin.    Marland Kitchen glucose blood (ONETOUCH ULTRA) test strip Use once daily to check blood sugar five times daily.  DXE11.9 150 each 11  . insulin aspart (NOVOLOG) 100 UNIT/ML injection [place] Max of 20 units daily via insulin pump. (Replaces Humalog b/c insurance will not cover). 20 mL 2  .  Insulin Infusion Pump (T:SLIM INSULIN DELIVERY SYSTEM) DEVI by Does not apply route.    . Insulin Pen Needle (BD PEN NEEDLE NANO 2ND GEN) 32G X 4 MM MISC Use as need for insulin 5 times daily Ell.9 200 each 6  . Insulin Pen Needle (NOVOFINE) 32G X 6 MM MISC Use with insulin pen 50 each 3  . Lancets MISC Use daily to check blood sugar  4 times daily Dx E11.9 200 each 6  . loratadine (CLARITIN) 10 MG tablet Take 10 mg by mouth daily.    . Melatonin 1 MG TABS Take by mouth.    . metoprolol succinate (TOPROL-XL) 25 MG 24 hr tablet TAKE 1 TABLET BY MOUTH EVERY DAY 90 tablet 3  . Multiple Vitamin (MULTIVITAMIN WITH MINERALS) TABS tablet Take 1 tablet by mouth daily.    Marland Kitchen oxycodone (OXY-IR) 5 MG capsule Take 5 mg by mouth every 4 (four) hours as needed.    . progesterone (PROMETRIUM) 100 MG capsule Take 1 capsule (100  mg total) by mouth as directed. 30 capsule 1  . SUMAtriptan (IMITREX) 20 MG/ACT nasal spray Place 1 spray into the nose every 2 (two) hours as needed for migraine.    Marland Kitchen omeprazole (PRILOSEC) 20 MG capsule Take 1 capsule (20 mg total) by mouth 2 (two) times daily. (Patient taking differently: Take 40 mg by mouth 2 (two) times daily. ) 180 capsule 3    Allergies Allergies  Allergen Reactions  . Lyrica [Pregabalin]   . Phenergan [Promethazine]   . Prochlorperazine     Review of Systems: Constitutional:  no unexpected weight changes Eye:  no recent significant change in vision Ear/Nose/Mouth/Throat:  Ears:  no recent change in hearing Nose/Mouth/Throat:  no complaints of nasal congestion, no sore throat Cardiovascular: no chest pain Respiratory:  no shortness of breath Gastrointestinal:  no abdominal pain, no change in bowel habits GU:  Female: negative for dysuria or pelvic pain Musculoskeletal/Extremities:  no pain of the joints Integumentary (Skin/Breast): As noted in HPI Neurologic:  no headaches Endocrine:  denies fatigue  Exam BP 130/80 (BP Location: Left Arm, Patient Position: Sitting, Cuff Size: Normal)   Pulse 79   Temp 98.5 F (36.9 C) (Oral)   Ht '5\' 2"'  (1.575 m)   Wt 143 lb 6 oz (65 kg)   SpO2 99%   BMI 26.22 kg/m  General:  well developed, well nourished, in no apparent distress Skin: Slightly raised and slightly hyperpigmented lesion on the left scalp over the zygomatic bone, there is some excoriation, some scaling, no erythema, fluctuance, or drainage.  It measures approximately 0.7 x 0.3 cm in dimension. Head:  no masses, lesions, or tenderness Eyes:  pupils equal and round, sclera anicteric without injection Ears:  canals without lesions, TMs shiny without retraction, no obvious effusion, no erythema Nose:  nares patent, septum midline, mucosa normal, and no drainage or sinus tenderness Throat/Pharynx:  lips and gingiva without lesion; tongue and uvula midline;  non-inflamed pharynx; no exudates or postnasal drainage Neck: neck supple without adenopathy, thyromegaly, or masses Lungs:  clear to auscultation, breath sounds equal bilaterally, no respiratory distress Cardio:  regular rate and rhythm, no LE edema Abdomen:  abdomen soft, diffusely tender/sore to palpation; bowel sounds normal; no masses or organomegaly Genital: Defer to GYN Musculoskeletal:  symmetrical muscle groups noted without atrophy or deformity Extremities:  no clubbing, cyanosis, or edema, no deformities, no skin discoloration Neuro:  gait normal; deep tendon reflexes normal and symmetric Psych: well oriented with  normal range of affect and appropriate judgment/insight  Procedure note: cryotherapy Verbal consent obtained A single skin lesion was treated Liquid nitrogen was applied via a thin spray creating an ice ball with 1-2 mm corona surrounding the lesion The patient tolerated the procedure well There were no immediate complications noted   Assessment and Plan  Well adult exam  Encounter for screening mammogram for malignant neoplasm of breast - Plan: MM DIGITAL SCREENING BILATERAL  Depression screening negative  Actinic keratosis - Plan: PR DESTRUC PREMAL,FIRST LESION  Mixed hyperlipidemia - Plan: atorvastatin (LIPITOR) 10 MG tablet, Comprehensive metabolic panel, Lipid panel  Screening for HIV (human immunodeficiency virus) - Plan: HIV Antibody (routine testing w rflx)   Well 65 y.o. female. Counseled on diet and exercise. Other orders as above. Frozen area on her scalp.  If no improvement, will shave it off. Follow up in 6 months. The patient voiced understanding and agreement to the plan.  [place], DO 10/04/20 3:01 PM

## 2020-10-05 LAB — COMPREHENSIVE METABOLIC PANEL
ALT: 14 U/L (ref 0–35)
AST: 16 U/L (ref 0–37)
Albumin: 4 g/dL (ref 3.5–5.2)
Alkaline Phosphatase: 76 U/L (ref 39–117)
BUN: 19 mg/dL (ref 6–23)
CO2: 33 mEq/L — ABNORMAL HIGH (ref 19–32)
Calcium: 9.4 mg/dL (ref 8.4–10.5)
Chloride: 97 mEq/L (ref 96–112)
Creatinine, Ser: 0.81 mg/dL (ref 0.40–1.20)
GFR: 77.27 mL/min (ref >60.00)
Glucose, Bld: 149 mg/dL — ABNORMAL HIGH (ref 70–99)
Potassium: 5 mEq/L (ref 3.5–5.1)
Sodium: 137 mEq/L (ref 135–145)
Total Bilirubin: 0.5 mg/dL (ref 0.2–1.2)
Total Protein: 6.9 g/dL (ref 6.0–8.3)

## 2020-10-05 LAB — LIPID PANEL
Cholesterol: 222 mg/dL — ABNORMAL HIGH (ref 0–200)
HDL: 65.6 mg/dL (ref >39.00)
LDL Cholesterol: 129 mg/dL — ABNORMAL HIGH (ref 0–99)
NonHDL: 156.23
Total CHOL/HDL Ratio: 3
Triglycerides: 137 mg/dL (ref 0.0–149.0)
VLDL: 27.4 mg/dL (ref 0.0–40.0)

## 2020-10-05 LAB — HIV ANTIBODY (ROUTINE TESTING W REFLEX): HIV 1&2 Ab, 4th Generation: NONREACTIVE

## 2020-10-12 DIAGNOSIS — Z124 Encounter for screening for malignant neoplasm of cervix: Secondary | ICD-10-CM | POA: Diagnosis not present

## 2020-10-12 DIAGNOSIS — N959 Unspecified menopausal and perimenopausal disorder: Secondary | ICD-10-CM | POA: Diagnosis not present

## 2020-10-12 DIAGNOSIS — Z01411 Encounter for gynecological examination (general) (routine) with abnormal findings: Secondary | ICD-10-CM | POA: Diagnosis not present

## 2020-10-19 ENCOUNTER — Encounter: Payer: Self-pay | Admitting: Orthopaedic Surgery

## 2020-10-19 ENCOUNTER — Other Ambulatory Visit: Payer: Self-pay

## 2020-10-19 ENCOUNTER — Ambulatory Visit: Payer: BC Managed Care – PPO | Admitting: Orthopaedic Surgery

## 2020-10-19 VITALS — Ht 62.0 in | Wt 143.0 lb

## 2020-10-19 DIAGNOSIS — M25512 Pain in left shoulder: Secondary | ICD-10-CM | POA: Diagnosis not present

## 2020-10-19 DIAGNOSIS — M25511 Pain in right shoulder: Secondary | ICD-10-CM

## 2020-10-19 DIAGNOSIS — G8929 Other chronic pain: Secondary | ICD-10-CM | POA: Diagnosis not present

## 2020-10-19 MED ORDER — LIDOCAINE HCL 2 % IJ SOLN
2.0000 mL | INTRAMUSCULAR | Status: AC | PRN
Start: 2020-10-19 — End: 2020-10-19
  Administered 2020-10-19: 2 mL

## 2020-10-19 MED ORDER — BUPIVACAINE HCL 0.25 % IJ SOLN
2.0000 mL | INTRAMUSCULAR | Status: AC | PRN
Start: 2020-10-19 — End: 2020-10-19
  Administered 2020-10-19: 2 mL via INTRA_ARTICULAR

## 2020-10-19 NOTE — Progress Notes (Signed)
Office Visit Note   Patient: Taylor [name]           Date of Birth: [birth year]           MRN: [id number] Visit Date: 10/19/2020              Requested by: Sharlene Dory, DO [address] [place],  [place] [address] PCP: Sharlene Dory, DO   Assessment & Plan: Visit Diagnoses:  1. Chronic pain of both shoulders     Plan: Impingement syndrome left shoulder.  Had good results with subacromial cortisone injection the past will repeat.  Also requesting physical therapy for cervical spine issues.  Will prescribe when Mrs Riendeau locates a physical therapy facility  Follow-Up Instructions: Return if symptoms worsen or fail to improve.   Orders:  Orders Placed This Encounter  Procedures  . Large Joint Inj: L subacromial bursa   No orders of the defined types were placed in this encounter.     Procedures: Large Joint Inj: L subacromial bursa on 10/19/2020 1:15 PM Indications: pain and diagnostic evaluation Details: 25 G 1.5 in needle, anterolateral approach  Arthrogram: No  Medications: 2 mL lidocaine 2 %; 2 mL bupivacaine 0.25 %  12 mg betamethasone injected the subacromial space left shoulder with Marcaine and Xylocaine Consent was given by the patient. Immediately prior to procedure a time out was called to verify the correct patient, procedure, equipment, support staff and site/side marked as required. Patient was prepped and draped in the usual sterile fashion.       Clinical Data: No additional findings.   Subjective: Chief Complaint  Patient presents with  . Right Shoulder - Pain  . Left Shoulder - Pain  Patient presents today for follow up on both her shoulders. She had her right shoulder injected 2.36months ago. She states that the injection helps, but today she still overall has more pain in the left shoulder when compared to the right side. She goes to a pain clinic and is taking Belbuca, oxycodone, and Tylenol for pain.  Has a  prior history of humeral head fracture with healing  HPI  Review of Systems   Objective: Vital Signs: Ht 5\' 2"  (1.575 m)   Wt 143 lb (64.9 kg)   BMI 26.16 kg/m   Physical Exam Constitutional:      Appearance: She is well-developed and well-nourished.  HENT:     Mouth/Throat:     Mouth: Oropharynx is clear and moist.  Eyes:     Extraocular Movements: EOM normal.     Pupils: Pupils are equal, round, and reactive to light.  Pulmonary:     Effort: Pulmonary effort is normal.  Skin:    General: Skin is warm and dry.  Neurological:     Mental Status: She is alert and oriented to person, place, and time.  Psychiatric:        Mood and Affect: Mood and affect normal.        Behavior: Behavior normal.     Ortho Exam awake alert and oriented x3.  Comfortable sitting.  Positive impingement left shoulder on extreme of external rotation.  No crepitation.  Mild anterior lateral subacromial pain.  No pain at the [facility] joint.  Good grip and release.  Had some mild pain with range of motion of her cervical spine with a little loss of neck extension but no referred pain to either upper extremity  Specialty Comments:  No specialty comments available.  Imaging: No results found.   PMFS History: Patient Active Problem List   Diagnosis Date Noted  . LADA (latent autoimmune diabetes in adults), managed as type 1 (HCC) 07/26/2020  . Hormone replacement therapy 07/26/2020  . Thyroid nodule 07/26/2020  . Postmenopause 03/23/2020  . Vitamin D insufficiency 10/28/2019  . Tingling in extremities 10/27/2019  . Epigastric pain 10/05/2019  . Bilateral shoulder pain 09/03/2019  . Latent autoimmune diabetes in adults (LADA), managed as type 1 (HCC) 05/22/2019  . Uncontrolled type 1 diabetes mellitus with hyperglycemia (HCC) 04/28/2019  . Diabetes mellitus type 2 in nonobese (HCC) 02/14/2019  . Chronic nonintractable headache 05/14/2018  . Gait instability 05/14/2018  . Gastroparesis 02/12/2018   . Essential hypertension, benign 04/18/2014  . Irritable bowel syndrome with constipation 11/15/2013  . Loss of weight 11/15/2013  . Acute upper respiratory infections of unspecified site 11/15/2013  . Post menopausal problems 11/15/2013  . GERD (gastroesophageal reflux disease) 03/23/2013  . Cerumen impaction 03/23/2013  . Unspecified constipation 03/23/2013  . Insomnia 03/23/2013  . Mixed hyperlipidemia 03/23/2013  . Palpitations 03/23/2013   Past Medical History:  Diagnosis Date  . Anxiety   . Colon polyp   . Gastroparesis   . GERD (gastroesophageal reflux disease)   . Hyperlipidemia   . Lupus (HCC)   . Migraine headache   . Osteoarthritis   . Premature atrial contractions   . Raynaud's phenomenon   . Symptomatic menopausal or female climacteric states     Family History  Problem Relation Age of Onset  . CVA Mother   . Alzheimer's disease Mother   . Heart disease Father   . Diabetes Father   . Hypertension Father   . Hyperlipidemia Father   . CVA Brother   . Cancer Maternal Aunt        Ovarian Cancer  . Cancer Maternal Uncle        Colon Cancer    Past Surgical History:  Procedure Laterality Date  . CHOLECYSTECTOMY    . HEMORRHOID SURGERY    . HIP PINNING    . LAPAROSCOPY    . NASAL SINUS SURGERY    . TOTAL KNEE ARTHROPLASTY     left knee  . WRIST SURGERY     Left wrist   Social History   Occupational History  . Occupation: HOMEMAKER  Tobacco Use  . Smoking status: Never Smoker  . Smokeless tobacco: Never Used  Substance and Sexual Activity  . Alcohol use: No  . Drug use: No  . Sexual activity: Not on file

## 2020-11-22 ENCOUNTER — Ambulatory Visit: Payer: BC Managed Care – PPO | Admitting: Internal Medicine

## 2020-11-29 ENCOUNTER — Encounter: Payer: Self-pay | Admitting: Internal Medicine

## 2020-11-29 ENCOUNTER — Ambulatory Visit (HOSPITAL_BASED_OUTPATIENT_CLINIC_OR_DEPARTMENT_OTHER)
Admission: RE | Admit: 2020-11-29 | Discharge: 2020-11-29 | Disposition: A | Discharge status: Home / Self Care | Payer: BC Managed Care – PPO | Source: Ambulatory Visit | Attending: Family Medicine | Admitting: Family Medicine

## 2020-11-29 ENCOUNTER — Other Ambulatory Visit: Payer: Self-pay

## 2020-11-29 ENCOUNTER — Ambulatory Visit: Payer: BC Managed Care – PPO | Admitting: Internal Medicine

## 2020-11-29 ENCOUNTER — Other Ambulatory Visit (HOSPITAL_BASED_OUTPATIENT_CLINIC_OR_DEPARTMENT_OTHER): Payer: Self-pay | Admitting: Family Medicine

## 2020-11-29 ENCOUNTER — Encounter (HOSPITAL_BASED_OUTPATIENT_CLINIC_OR_DEPARTMENT_OTHER): Payer: Self-pay

## 2020-11-29 VITALS — BP 130/82 | HR 80 | Ht 62.0 in | Wt 144.2 lb

## 2020-11-29 DIAGNOSIS — Z79890 Hormone replacement therapy (postmenopausal): Secondary | ICD-10-CM | POA: Diagnosis not present

## 2020-11-29 DIAGNOSIS — Z1231 Encounter for screening mammogram for malignant neoplasm of breast: Secondary | ICD-10-CM | POA: Insufficient documentation

## 2020-11-29 DIAGNOSIS — E139 Other specified diabetes mellitus without complications: Secondary | ICD-10-CM | POA: Diagnosis not present

## 2020-11-29 DIAGNOSIS — Z780 Asymptomatic menopausal state: Secondary | ICD-10-CM

## 2020-11-29 DIAGNOSIS — R42 Dizziness and giddiness: Secondary | ICD-10-CM | POA: Diagnosis not present

## 2020-11-29 LAB — POCT GLYCOSYLATED HEMOGLOBIN (HGB A1C): Hemoglobin A1C: 6.7 % — AB (ref 4.0–5.6)

## 2020-11-29 MED ORDER — INSULIN ASPART 100 UNIT/ML ~~LOC~~ SOLN: SUBCUTANEOUS | 3 refills | Status: DC

## 2020-11-29 MED ORDER — INSULIN LISPRO 100 UNIT/ML ~~LOC~~ SOLN: SUBCUTANEOUS | 3 refills | Status: DC

## 2020-11-29 NOTE — Patient Instructions (Addendum)
-   Continue pump changes  - Climara 0.05 patch  To be changes once a week  - Progesterone 1 tablet on days 1-10 of the month

## 2020-11-29 NOTE — Progress Notes (Signed)
Name: Taylor [name]  Age/ Sex: 65 y.o., female   MRN/ DOB: [id number], 1956-04-06     PCP: Shelda Pal, DO   Reason for Endocrinology Evaluation: Type 1 Diabetes Mellitus  Initial Endocrine Consultative Visit: 04/06/19    PATIENT IDENTIFIER: [name] is a 65 y.o. female with a past medical history of T1DM, SLE, GERD, Gastroparesis. The patient has followed with Endocrinology clinic since 04/06/2019 for consultative assistance with management of her diabetes.      DIABETIC HISTORY:  Ms. Augenstein was diagnosed with T1DM in 01/2019. Her hemoglobin A1c was 11.1% on diagnosis. No DKA. Islet cell Ab's positive, consistent with L ADA. She was initially was put on an insulin mix but due to fluctuating BG's she was changed to an MDI regimen.  She was started on an insulin pump January 2021    Thyroid Nodule History :  She was found to have an incidental MNG on CT scan. A dominant right thyroid nodule of 1.6x0.9x1 cm was performed with benign cytology in 05/2015 Due to concerns of weight loss and feeling poorly a cortisol was checked by her endocrinologist in 05/2018 at 9.8 mcg/dL    HORMONE REPLACEMENT THERAPY: She has been on Hormonal replacement therapy for > 5 yrs. She has been menopausal since ~ the age of 33 .   SUBJECTIVE:   During the last visit (07/13/2020): A1c 6.6 %. No changes to insulin pump settings.     Today (03/22/2020): Ms. Hearld is here for a follow up on diabetes management . She uses the CGM multiple times a day for glucose checks.    She has not had many issues with being on HRT , she continues with cold sweats , BG's are normal during these episodes.  She reduced metoprolol due to dizziness  Continues with constipation and nausea  continues with tightness on the legs during rest and motion    HOME DIABETES REGIMEN:  Humalog through [facility] Estradiole patch ( Climara) 0.05 to be changed weekly Progesterone 100 mg days 1-10 of each  month    PUMP SETTINGS & DOWNLOAD   BASAL SETTINGS: Time  Basal Rate (units/ hr)   0000 0.136     Total 24-hour basal: 3.3  BOLUS SETTINGS: Insulin:Carb Ratio: 1:13 Sensitivity: 80 Target BG: 120 Active Insulin Time: 5 hours  CURRENT PUMP STATISTICS:7/20-03/21/2020 Average BG: 158 Average Daily Carbs (g): 129 Average Daily Basal: 5(29 %) Average Daily Bolus: 12 (71 %) Total daily insulin 11.94   DIABETIC COMPLICATIONS: Microvascular complications:    Denies: CKD, neuropathy, retinopathy   Last eye exam: Completed 07/13/2020  Macrovascular complications:    Denies: CAD, PVD, CVA  HISTORY:  Past Medical History:  Past Medical History:  Diagnosis Date  . Anxiety   . Colon polyp   . Gastroparesis   . GERD (gastroesophageal reflux disease)   . Hyperlipidemia   . Lupus ([facility])   . Migraine headache   . Osteoarthritis   . Premature atrial contractions   . Raynaud's phenomenon   . Symptomatic menopausal or female climacteric states    Past Surgical History:  Past Surgical History:  Procedure Laterality Date  . CHOLECYSTECTOMY    . HEMORRHOID SURGERY    . HIP PINNING    . LAPAROSCOPY    . NASAL SINUS SURGERY    . TOTAL KNEE ARTHROPLASTY     left knee  . WRIST SURGERY     Left wrist    Social History:  reports that she has never smoked. She has never used smokeless tobacco. She reports that she does not drink alcohol and does not use drugs. Family History:  Family History  Problem Relation Age of Onset  . CVA Mother   . Alzheimer's disease Mother   . Heart disease Father   . Diabetes Father   . Hypertension Father   . Hyperlipidemia Father   . CVA Brother   . Cancer Maternal Aunt        Ovarian Cancer  . Cancer Maternal Uncle        Colon Cancer     HOME MEDICATIONS: Allergies as of 11/29/2020      Reactions   Lyrica [pregabalin]    Phenergan [promethazine]    Prochlorperazine       Medication List       Accurate as of November 29, 2020  7:42 AM. If you have any questions, ask your nurse or doctor.        aspirin 81 MG tablet Take 81 mg by mouth daily.   atorvastatin 10 MG tablet Commonly known as: LIPITOR Take 1 tablet (10 mg total) by mouth daily.   Buprenorphine HCl 450 MCG Film Place 1 patch inside cheek 2 (two) times daily.   celecoxib 200 MG capsule Commonly known as: CELEBREX Take 200 mg by mouth daily.   cholecalciferol 25 MCG (1000 UNIT) tablet Commonly known as: VITAMIN D3 Take 1,000 Units by mouth daily.   Dexcom G6 Receiver Devi 1 each by Does not apply route See admin instructions. For continuous glucose monitoring; E13.9   Dexcom G6 Sensor Misc 1 each by Does not apply route See admin instructions. For use with continuous glucose monitoring system. Change sensor every 10 days; E13.9   Dexcom G6 Transmitter Misc 1 each by Does not apply route See admin instructions. For continuous glucose monitoring; E13.9   docusate sodium 100 MG capsule Commonly known as: COLACE Take 100 mg by mouth 2 (two) times daily.   estradiol 0.05 mg/24hr patch Commonly known as: Climara Place 1 patch (0.05 mg total) onto the skin once a week.   famotidine 40 MG tablet Commonly known as: PEPCID Take 40 mg by mouth daily.   FLUoxetine 40 MG capsule Commonly known as: PROZAC Take 40 mg by mouth daily.   Galcanezumab-gnlm 120 MG/ML Soaj Inject into the skin.   insulin aspart 100 UNIT/ML injection Commonly known as: NovoLOG [place] Max of 20 units daily via insulin pump. (Replaces Humalog b/c insurance will not cover).   Lancets Misc Use daily to check blood sugar  4 times daily Dx E11.9   loratadine 10 MG tablet Commonly known as: CLARITIN Take 10 mg by mouth daily.   melatonin 1 MG Tabs tablet Take by mouth.   metoprolol succinate 25 MG 24 hr tablet Commonly known as: TOPROL-XL TAKE 1 TABLET BY MOUTH EVERY DAY   multivitamin with minerals Tabs tablet Take 1 tablet by mouth daily.   NovoFine  32G X 6 MM Misc Generic drug: Insulin Pen Needle Use with insulin pen   BD Pen Needle Nano 2nd Gen 32G X 4 MM Misc Generic drug: Insulin Pen Needle Use as need for insulin 5 times daily Ell.9   omeprazole 20 MG capsule Commonly known as: PRILOSEC Take 1 capsule (20 mg total) by mouth 2 (two) times daily. What changed: how much to take   ONE TOUCH ULTRA 2 w/Device Kit Use once daily to check blood sugar..   DX E11.9  OneTouch Ultra test strip Generic drug: glucose blood Use once daily to check blood sugar five times daily.  DXE11.9   oxycodone 5 MG capsule Commonly known as: OXY-IR Take 5 mg by mouth every 4 (four) hours as needed.   progesterone 100 MG capsule Commonly known as: PROMETRIUM Take 1 capsule (100 mg total) by mouth as directed.   SUMAtriptan 20 MG/ACT nasal spray Commonly known as: IMITREX Place 1 spray into the nose every 2 (two) hours as needed for migraine.   T:slim Insulin Delivery System Devi by Does not apply route.        OBJECTIVE:   Vital Signs: BP 130/82   Pulse 80   Ht '5\' 2"'  (1.575 m)   Wt 144 lb 4 oz (65.4 kg)   SpO2 98%   BMI 26.38 kg/m   Wt Readings from Last 3 Encounters:  11/29/20 144 lb 4 oz (65.4 kg)  10/19/20 143 lb (64.9 kg)  10/04/20 143 lb 6 oz (65 kg)     Exam: General: Pt appears well and is in NAD  Lungs: Clear with good BS bilat with no rales, rhonchi, or wheezes  Heart: RRR with normal  Extremities: No pretibial edema.   Neuro: MS is good with appropriate affect, pt is alert and Ox3    DM foot exam: 11/29/2020  The skin of the feet is intact without sores or ulcerations. The pedal pulses are 2+ on right and 2+ on left. The sensation is absent  to a screening 5.07, 10 gram monofilament bilaterally    DATA REVIEWED:  Lab Results  Component Value Date   HGBA1C 6.6 (A) 07/26/2020   HGBA1C 6.1 (A) 03/22/2020   HGBA1C 5.6 12/23/2019     Results for GILMA, BESSETTE (MRN [id number]) as of 08/31/2020 08:48   Ref. Range 08/30/2020 11:14  Sodium Latest Ref Range: 135 - 145 mEq/L 136  Potassium Latest Ref Range: 3.5 - 5.1 mEq/L 4.7  Chloride Latest Ref Range: 96 - 112 mEq/L 97  CO2 Latest Ref Range: 19 - 32 mEq/L 35 (H)  Glucose Latest Ref Range: 70 - 99 mg/dL 189 (H)  BUN Latest Ref Range: 6 - 23 mg/dL 12  Creatinine Latest Ref Range: 0.40 - 1.20 mg/dL 0.75  Calcium Latest Ref Range: 8.4 - 10.5 mg/dL 9.1  GFR Latest Ref Range: >60.00 mL/min 84.81  URINALYSIS, ROUTINE W REFLEX MICROSCOPIC Unknown Rpt (A)  Appearance Latest Ref Range: Clear;Turbid;Slightly Cloudy;Cloudy  Sl Cloudy (A)  Bilirubin Urine Latest Ref Range: Negative  NEGATIVE  Color, Urine Latest Ref Range: Yellow;Lt. Yellow;Straw;Dark Yellow;Amber;Green;Red;Brown  YELLOW  Hgb urine dipstick Latest Ref Range: Negative  TRACE-LYSED (A)  Ketones, ur Latest Ref Range: Negative  NEGATIVE  Leukocytes,Ua Latest Ref Range: Negative  NEGATIVE  Nitrite Latest Ref Range: Negative  NEGATIVE  pH Latest Ref Range: 5.0 - 8.0  6.0  Specific Gravity, Urine Latest Ref Range: 1.000 - 1.030  1.020  Urine Glucose Latest Ref Range: Negative  NEGATIVE  Urobilinogen, UA Latest Ref Range: 0.0 - 1.0  0.2  Bacteria, UA Latest Ref Range: None  Few(10-50/hpf) (A)  Ca Oxalate Crys, UA Latest Ref Range: None  Presence of (A)  Mucus, UA Latest Ref Range: None  Presence of (A)  RBC / HPF Latest Ref Range: 0-2/hpf  0-2/hpf  Squamous Epithelial / LPF Latest Ref Range: Rare(0-4/hpf)  Rare(0-4/hpf)  WBC, UA Latest Ref Range: 0-2/hpf  0-2/hpf  Total Protein, Urine-UPE24 Latest Ref Range: Negative  NEGATIVE    Results  for WINTA, BARCELO (MRN [id number]) as of 05/22/2019 14:05  Ref. Range 04/06/2019 14:43 04/06/2019 15:02  Glutamic Acid Decarb Ab Latest Ref Range: <5 IU/mL >250 (H)   Islet Cell Ab Titer Latest Units: JDF units  1,280 (H)    ASSESSMENT / PLAN / RECOMMENDATIONS:   1) Latent Autoimmune Diabetes Mellitus: - Most recent A1c of 6.7 %. Goal A1c <  7.0%   - A1c continues to be optimal  - No changes   MEDICATIONS: Humalog   T-Slim Pump setting   Pump   T-Slim   Settings   Insulin type   Humalog    Basal rate       0000-0000 0.136              I:C ratio       0000-0000  1:13          AIT  5 hrs      Sensitivity       0000  80      Goal       0000  80-120      EDUCATION / INSTRUCTIONS:  BG monitoring instructions: Patient is instructed to check her blood sugars 4 times a day  Call [facility] if: BG persistently < 70  . I reviewed the Rule of 15 for the treatment of hypoglycemia in detail with the patient. Literature supplied.     2) Hormonal Replacement Therapy:    - Hot flashes have almost resolved with the current regimen    - Continue Climara 0.05 patch  To be changes once a week  - Continue Progesterone 1 tablet on days 1-10 of the month   3. Thyroid Nodule :  - No local neck symptoms  - She is s/p benign FNA of the right dominant thyroid nodule in 2016. Awaiting on ultrasound , will check with the facility   4. Dizziness :  - This is not related to glucose, I am ok with reducing metoprolol to half a tablet daily and see if this improves   F/U in 6 months    Signed electronically by: Mack Guise, MD  [facility]  [facility] [address] [place], [place] [address] Phone: [phone] FAX: [phone]   CC: Shelda Pal, [address] [place] [address] Phone: [phone]  Fax: [phone]  Return to Endocrinology clinic as below: Future Appointments  Date Time Provider [place]  04/03/2021  1:00 PM Shelda Pal, DO [facility] [place]

## 2020-11-30 ENCOUNTER — Other Ambulatory Visit: Payer: Self-pay | Admitting: Family Medicine

## 2020-11-30 DIAGNOSIS — R928 Other abnormal and inconclusive findings on diagnostic imaging of breast: Secondary | ICD-10-CM

## 2020-12-01 DIAGNOSIS — M4802 Spinal stenosis, cervical region: Secondary | ICD-10-CM | POA: Diagnosis not present

## 2020-12-01 DIAGNOSIS — M797 Fibromyalgia: Secondary | ICD-10-CM | POA: Diagnosis not present

## 2020-12-01 DIAGNOSIS — M961 Postlaminectomy syndrome, not elsewhere classified: Secondary | ICD-10-CM | POA: Diagnosis not present

## 2020-12-01 DIAGNOSIS — M5030 Other cervical disc degeneration, unspecified cervical region: Secondary | ICD-10-CM | POA: Diagnosis not present

## 2020-12-12 DIAGNOSIS — E108 Type 1 diabetes mellitus with unspecified complications: Secondary | ICD-10-CM | POA: Diagnosis not present

## 2020-12-12 DIAGNOSIS — E139 Other specified diabetes mellitus without complications: Secondary | ICD-10-CM | POA: Diagnosis not present

## 2020-12-23 ENCOUNTER — Ambulatory Visit
Admission: RE | Admit: 2020-12-23 | Discharge: 2020-12-23 | Disposition: A | Discharge status: Home / Self Care | Payer: BC Managed Care – PPO | Source: Ambulatory Visit | Attending: Family Medicine | Admitting: Family Medicine

## 2020-12-23 ENCOUNTER — Other Ambulatory Visit: Payer: Self-pay | Admitting: Family Medicine

## 2020-12-23 ENCOUNTER — Other Ambulatory Visit: Payer: Self-pay

## 2020-12-23 DIAGNOSIS — R928 Other abnormal and inconclusive findings on diagnostic imaging of breast: Secondary | ICD-10-CM

## 2020-12-23 DIAGNOSIS — N6489 Other specified disorders of breast: Secondary | ICD-10-CM | POA: Diagnosis not present

## 2021-01-06 DIAGNOSIS — N951 Menopausal and female climacteric states: Secondary | ICD-10-CM | POA: Diagnosis not present

## 2021-02-22 DIAGNOSIS — M797 Fibromyalgia: Secondary | ICD-10-CM | POA: Diagnosis not present

## 2021-02-22 DIAGNOSIS — M5030 Other cervical disc degeneration, unspecified cervical region: Secondary | ICD-10-CM | POA: Diagnosis not present

## 2021-02-22 DIAGNOSIS — M961 Postlaminectomy syndrome, not elsewhere classified: Secondary | ICD-10-CM | POA: Diagnosis not present

## 2021-03-07 DIAGNOSIS — E108 Type 1 diabetes mellitus with unspecified complications: Secondary | ICD-10-CM | POA: Diagnosis not present

## 2021-03-07 DIAGNOSIS — E139 Other specified diabetes mellitus without complications: Secondary | ICD-10-CM | POA: Diagnosis not present

## 2021-04-03 ENCOUNTER — Ambulatory Visit: Payer: BC Managed Care – PPO | Admitting: Family Medicine

## 2021-04-03 ENCOUNTER — Other Ambulatory Visit: Payer: Self-pay

## 2021-04-03 ENCOUNTER — Encounter: Payer: Self-pay | Admitting: Family Medicine

## 2021-04-03 VITALS — BP 110/78 | HR 83 | Temp 98.4°F | Ht 62.0 in | Wt 141.5 lb

## 2021-04-03 DIAGNOSIS — R002 Palpitations: Secondary | ICD-10-CM | POA: Diagnosis not present

## 2021-04-03 DIAGNOSIS — K146 Glossodynia: Secondary | ICD-10-CM

## 2021-04-03 DIAGNOSIS — K5900 Constipation, unspecified: Secondary | ICD-10-CM

## 2021-04-03 DIAGNOSIS — E041 Nontoxic single thyroid nodule: Secondary | ICD-10-CM | POA: Diagnosis not present

## 2021-04-03 DIAGNOSIS — E782 Mixed hyperlipidemia: Secondary | ICD-10-CM | POA: Diagnosis not present

## 2021-04-03 DIAGNOSIS — R339 Retention of urine, unspecified: Secondary | ICD-10-CM

## 2021-04-03 LAB — COMPREHENSIVE METABOLIC PANEL
ALT: 11 U/L (ref 0–35)
AST: 17 U/L (ref 0–37)
Albumin: 4 g/dL (ref 3.5–5.2)
Alkaline Phosphatase: 72 U/L (ref 39–117)
BUN: 14 mg/dL (ref 6–23)
CO2: 34 mEq/L — ABNORMAL HIGH (ref 19–32)
Calcium: 9.1 mg/dL (ref 8.4–10.5)
Chloride: 98 mEq/L (ref 96–112)
Creatinine, Ser: 0.75 mg/dL (ref 0.40–1.20)
GFR: 84.46 mL/min (ref >60.00)
Glucose, Bld: 130 mg/dL — ABNORMAL HIGH (ref 70–99)
Potassium: 4.8 mEq/L (ref 3.5–5.1)
Sodium: 138 mEq/L (ref 135–145)
Total Bilirubin: 0.4 mg/dL (ref 0.2–1.2)
Total Protein: 6.8 g/dL (ref 6.0–8.3)

## 2021-04-03 LAB — LIPID PANEL
Cholesterol: 189 mg/dL (ref 0–200)
HDL: 64 mg/dL (ref >39.00)
LDL Cholesterol: 109 mg/dL — ABNORMAL HIGH (ref 0–99)
NonHDL: 125.02
Total CHOL/HDL Ratio: 3
Triglycerides: 79 mg/dL (ref 0.0–149.0)
VLDL: 15.8 mg/dL (ref 0.0–40.0)

## 2021-04-03 LAB — T4, FREE: Free T4: 0.78 ng/dL (ref 0.60–1.60)

## 2021-04-03 LAB — TSH: TSH: 1.21 u[IU]/mL (ref 0.35–5.50)

## 2021-04-03 MED ORDER — ATENOLOL 50 MG PO TABS: 50.0000 mg | ORAL_TABLET | Freq: Every day | ORAL | 2 refills | Status: DC

## 2021-04-03 MED ORDER — LIDOCAINE VISCOUS HCL 2 % MT SOLN: 10.0000 mL | OROMUCOSAL | 1 refills | Status: DC | PRN

## 2021-04-03 NOTE — Patient Instructions (Addendum)
Give [place] 2-3 business days to get the results of your labs back.   Keep the diet clean and stay active.  Try to adjust position with urination. We could get you set up with a urologist, let me know.   Make an appointment with your GI doc.   Let us know if you need anything.

## 2021-04-03 NOTE — Progress Notes (Signed)
Chief Complaint  Patient presents with   Follow-up    6 month    Subjective: Hyperlipidemia Patient presents for Hyperlipidemia follow up. Currently taking Lipitor 20 mg/d and compliance with treatment thus far has been good. She denies myalgias. She is adhering to a healthy diet. Exercise: some walking The patient is not known to have coexisting coronary artery disease.  Palpitations Hx of palpitations w/o HTN. She was taking Toprol XL 12.5 mg/d. Was getting some lightheadedness. She was on atenolol in the past and did well. Has not lost consciousness.   After starting insulin she was doing well w BM's. Started having worsening BM's back to 2-3 times per month. Wondering if she needs to see her GI again.  Having urinary retention where she doesn't fully empty her bladder. No correlation w BM's but does note she fully empties when she has one. No pain, bleeding, fevers. Changing position, like when she wipes, will help her void more urine.   Has burning over the tip of her tongue that bothers her mainly at night. She sees a pain clinic and ends up using more pain medication in the middle of the night because of it. She uses rx toothpaste due to this. No new topicals. She also uses chlorhex mouthwash.   Past Medical History:  Diagnosis Date   Anxiety    Colon polyp    Gastroparesis    GERD (gastroesophageal reflux disease)    Hyperlipidemia    Lupus (HCC)    Migraine headache    Osteoarthritis    Premature atrial contractions    Raynaud's phenomenon    Symptomatic menopausal or female climacteric states     Objective: BP 110/78   Pulse 83   Temp 98.4 F (36.9 C) (Oral)   Ht 5\' 2"  (1.575 m)   Wt 141 lb 8 oz (64.2 kg)   SpO2 99%   BMI 25.88 kg/m  General: Awake, appears stated age HEENT: MMM, tongue neg Abd: Diffusely ttp, ND, BS+ Heart: RRR, no LE edema, no bruits Lungs: CTAB, no rales, wheezes or rhonchi. No accessory muscle use Psych: Age appropriate judgment and  insight, normal affect and mood  Assessment and Plan: Mixed hyperlipidemia - Plan: Comprehensive metabolic panel, Lipid panel  Palpitations - Plan: atenolol (TENORMIN) 50 MG tablet  Thyroid nodule - Plan: T4, free, TSH  Constipation, unspecified constipation type  Urinary retention  Burning tongue - Plan: lidocaine (XYLOCAINE) 2 % solution  Ck labs, we had increased Lipitor to 20 mg/d. Counseled on diet/exercise. Change Toprol to atenolol 50 mg/d as she had done well with this in past. She will let me know if things do not improve. Ck labs. Exercise. Get back in with GI. Failed Reglan. Could be 2/2 #4. Encouraged positional changes. Caffeine/alcohol in moderation. Lidocaine swish. Do not swallow. Burning tongue syndrome? F/u in 6 mo for CPE or prn. The patient voiced understanding and agreement to the plan.  I spent 42 min w patient review the above and reviewing her chart on the same day of visit.   [place], DO 04/03/21  2:05 PM

## 2021-04-04 ENCOUNTER — Ambulatory Visit: Payer: BC Managed Care – PPO | Admitting: Internal Medicine

## 2021-04-04 ENCOUNTER — Encounter: Payer: Self-pay | Admitting: Internal Medicine

## 2021-04-04 VITALS — BP 120/82 | HR 89 | Ht 62.0 in | Wt 141.6 lb

## 2021-04-04 DIAGNOSIS — E041 Nontoxic single thyroid nodule: Secondary | ICD-10-CM

## 2021-04-04 DIAGNOSIS — E119 Type 2 diabetes mellitus without complications: Secondary | ICD-10-CM

## 2021-04-04 DIAGNOSIS — E785 Hyperlipidemia, unspecified: Secondary | ICD-10-CM

## 2021-04-04 LAB — POCT GLYCOSYLATED HEMOGLOBIN (HGB A1C): Hemoglobin A1C: 6.4 % — AB (ref 4.0–5.6)

## 2021-04-04 MED ORDER — ATORVASTATIN CALCIUM 20 MG PO TABS: 20.0000 mg | ORAL_TABLET | Freq: Every day | ORAL | 3 refills | Status: DC

## 2021-04-04 NOTE — Progress Notes (Signed)
Name: Taylor [name]  Age/ Sex: 65 y.o., female   MRN/ DOB: [id number], Jan 03, 1956     PCP: Shelda Pal, DO   Reason for Endocrinology Evaluation: Type 1 Diabetes Mellitus  Initial Endocrine Consultative Visit: 04/06/19    PATIENT IDENTIFIER: Taylor [name] is a 65 y.o. female with a past medical history of T1DM, SLE, GERD, Gastroparesis. The patient has followed with Endocrinology clinic since 04/06/2019 for consultative assistance with management of her diabetes.      DIABETIC HISTORY:  Taylor [name] was diagnosed with T1DM in 01/2019. Her hemoglobin A1c was 11.1% on diagnosis. No DKA. Islet cell Ab's positive, consistent with L ADA. She was initially was put on an insulin mix but due to fluctuating BG's she was changed to an MDI regimen.  She was started on an insulin pump January 2021    Thyroid Nodule History :  She was found to have an incidental MNG on CT scan. A dominant right thyroid nodule of 1.6x0.9x1 cm was performed with benign cytology in 05/2015 Due to concerns of weight loss and feeling poorly a cortisol was checked by her endocrinologist in 05/2018 at 9.8 mcg/dL    HORMONE REPLACEMENT THERAPY: She has been on Hormonal replacement therapy for > 5 yrs. She has been menopausal since ~ the age of 10 .  Follows with Gyn   SUBJECTIVE:   During the last visit (11/29/2020): A1c 6.7 %. No changes to insulin pump settings.     Today (03/22/2020): Taylor [name] is here for a follow up on diabetes management . She uses the CGM multiple times a day for glucose checks. Has had occasional hypoglycemia with symptoms.      Has been noted with elevated LDL , has been taking 10 mg daily   She has not had many issues with being on HRT ,she started seeing her Gyn for that , with repeat mammogram in 06/2021  Has neck sweating with mouth pain and tongue burning  Has with leg pains as well as tight sensation in both legs as well as pin needles  Has occasional leg  swelling    HOME DIABETES REGIMEN:  Humalog through [facility]     PUMP SETTINGS & DOWNLOAD   BASAL SETTINGS: Time  Basal Rate (units/ hr)   0000 0.136     Total 24-hour basal: 20.4  BOLUS SETTINGS: Insulin:Carb Ratio: 1:13 Sensitivity: 80 Target BG: 120 Active Insulin Time: 5 hours  CURRENT PUMP STATISTICS:7/20-03/21/2020 Average BG: 156 Average Daily Carbs (g): 205 Average Daily Basal: 4.0(22%) Average Daily Bolus: 16 (78%) Total daily insulin 20.40    DIABETIC COMPLICATIONS: Microvascular complications:    Denies: CKD, neuropathy, retinopathy  Last eye exam: Completed 07/13/2020   Macrovascular complications:    Denies: CAD, PVD, CVA  HISTORY:  Past Medical History:  Past Medical History:  Diagnosis Date   Anxiety    Colon polyp    Gastroparesis    GERD (gastroesophageal reflux disease)    Hyperlipidemia    Lupus (HCC)    Migraine headache    Osteoarthritis    Premature atrial contractions    Raynaud's phenomenon    Symptomatic menopausal or female climacteric states    Past Surgical History:  Past Surgical History:  Procedure Laterality Date   CHOLECYSTECTOMY     HEMORRHOID SURGERY     HIP PINNING     LAPAROSCOPY     NASAL SINUS SURGERY     TOTAL KNEE ARTHROPLASTY  left knee   WRIST SURGERY     Left wrist   Social History:  reports that she has never smoked. She has never used smokeless tobacco. She reports that she does not drink alcohol and does not use drugs. Family History:  Family History  Problem Relation Age of Onset   CVA Mother    Alzheimer's disease Mother    Heart disease Father    Diabetes Father    Hypertension Father    Hyperlipidemia Father    CVA Brother    Cancer Maternal Aunt        Ovarian Cancer   Cancer Maternal Uncle        Colon Cancer     HOME MEDICATIONS: Allergies as of 04/04/2021       Reactions   Lyrica [pregabalin]    Phenergan [promethazine]    Prochlorperazine         Medication List         Accurate as of April 04, 2021  2:04 PM. If you have any questions, ask your nurse or doctor.          aspirin 81 MG tablet Take 81 mg by mouth daily.   atenolol 50 MG tablet Commonly known as: TENORMIN Take 1 tablet (50 mg total) by mouth daily.   Buprenorphine HCl 450 MCG Film Place 1 patch inside cheek 2 (two) times daily.   celecoxib 200 MG capsule Commonly known as: CELEBREX Take 200 mg by mouth daily.   cholecalciferol 25 MCG (1000 UNIT) tablet Commonly known as: VITAMIN D3 Take 1,000 Units by mouth daily.   Dexcom G6 Receiver Devi 1 each by Does not apply route See admin instructions. For continuous glucose monitoring; E13.9   Dexcom G6 Sensor Misc 1 each by Does not apply route See admin instructions. For use with continuous glucose monitoring system. Change sensor every 10 days; E13.9   Dexcom G6 Transmitter Misc 1 each by Does not apply route See admin instructions. For continuous glucose monitoring; E13.9   docusate sodium 100 MG capsule Commonly known as: COLACE Take 100 mg by mouth 2 (two) times daily.   estradiol 0.05 mg/24hr patch Commonly known as: Climara Place 1 patch (0.05 mg total) onto the skin once a week.   famotidine 40 MG tablet Commonly known as: PEPCID Take 40 mg by mouth daily.   FLUoxetine 40 MG capsule Commonly known as: PROZAC Take 40 mg by mouth daily.   Galcanezumab-gnlm 120 MG/ML Soaj Inject into the skin.   insulin lispro 100 UNIT/ML injection Commonly known as: HumaLOG Max daily 30 units through pump   Lancets Misc Use daily to check blood sugar  4 times daily Dx E11.9   lidocaine 2 % solution Commonly known as: XYLOCAINE Use as directed 10 mLs in the mouth or throat as needed for mouth pain.   loratadine 10 MG tablet Commonly known as: CLARITIN Take 10 mg by mouth daily.   melatonin 1 MG Tabs tablet Take by mouth.   multivitamin with minerals Tabs tablet Take 1 tablet by mouth daily.   NovoFine 32G X  6 MM Misc Generic drug: Insulin Pen Needle Use with insulin pen   BD Pen Needle Nano 2nd Gen 32G X 4 MM Misc Generic drug: Insulin Pen Needle Use as need for insulin 5 times daily Ell.9   omeprazole 20 MG capsule Commonly known as: PRILOSEC Take 1 capsule (20 mg total) by mouth 2 (two) times daily. What changed: how much to take  ONE TOUCH ULTRA 2 w/Device Kit Use once daily to check blood sugar..   DX E11.9   OneTouch Ultra test strip Generic drug: glucose blood Use once daily to check blood sugar five times daily.  DXE11.9   oxycodone 5 MG capsule Commonly known as: OXY-IR Take 5 mg by mouth every 4 (four) hours as needed.   progesterone 100 MG capsule Commonly known as: PROMETRIUM Take 1 capsule (100 mg total) by mouth as directed.   SUMAtriptan 20 MG/ACT nasal spray Commonly known as: IMITREX Place 1 spray into the nose every 2 (two) hours as needed for migraine.   T:slim Insulin Delivery System Devi by Does not apply route.         OBJECTIVE:   Vital Signs: BP 120/82   Pulse 89   Ht '5\' 2"'  (1.575 m)   Wt 141 lb 9.6 oz (64.2 kg)   SpO2 98%   BMI 25.90 kg/m   Wt Readings from Last 3 Encounters:  04/04/21 141 lb 9.6 oz (64.2 kg)  04/03/21 141 lb 8 oz (64.2 kg)  11/29/20 144 lb 4 oz (65.4 kg)     Exam: General: Pt appears well and is in NAD  Lungs: Clear with good BS bilat with no rales, rhonchi, or wheezes  Heart: RRR with normal  Extremities: No pretibial edema.   Neuro: MS is good with appropriate affect, pt is alert and Ox3    DM foot exam: 11/29/2020  The skin of the feet is intact without sores or ulcerations. The pedal pulses are 2+ on right and 2+ on left. The sensation is absent  to a screening 5.07, 10 gram monofilament bilaterally    DATA REVIEWED:  Lab Results  Component Value Date   HGBA1C 6.7 (A) 11/29/2020   HGBA1C 6.6 (A) 07/26/2020   HGBA1C 6.1 (A) 03/22/2020   Results for Taylor [name], Taylor [name] (MRN [id number]) as of 04/04/2021  13:00  Ref. Range 04/03/2021 13:36  Sodium Latest Ref Range: 135 - 145 mEq/L 138  Potassium Latest Ref Range: 3.5 - 5.1 mEq/L 4.8  Chloride Latest Ref Range: 96 - 112 mEq/L 98  CO2 Latest Ref Range: 19 - 32 mEq/L 34 (H)  Glucose Latest Ref Range: 70 - 99 mg/dL 130 (H)  BUN Latest Ref Range: 6 - 23 mg/dL 14  Creatinine Latest Ref Range: 0.40 - 1.20 mg/dL 0.75  Calcium Latest Ref Range: 8.4 - 10.5 mg/dL 9.1  Alkaline Phosphatase Latest Ref Range: 39 - 117 U/L 72  Albumin Latest Ref Range: 3.5 - 5.2 g/dL 4.0  AST Latest Ref Range: 0 - 37 U/L 17  ALT Latest Ref Range: 0 - 35 U/L 11  Total Protein Latest Ref Range: 6.0 - 8.3 g/dL 6.8  Total Bilirubin Latest Ref Range: 0.2 - 1.2 mg/dL 0.4  GFR Latest Ref Range: >60.00 mL/min 84.46  Total CHOL/HDL Ratio Unknown 3  Cholesterol Latest Ref Range: 0 - 200 mg/dL 189  HDL Cholesterol Latest Ref Range: >39.00 mg/dL 64.00  LDL (calc) Latest Ref Range: 0 - 99 mg/dL 109 (H)  NonHDL Unknown 125.02  Triglycerides Latest Ref Range: 0.0 - 149.0 mg/dL 79.0  VLDL Latest Ref Range: 0.0 - 40.0 mg/dL 15.8  TSH Latest Ref Range: 0.35 - 5.50 uIU/mL 1.21  T4,Free(Direct) Latest Ref Range: 0.60 - 1.60 ng/dL 0.78    Results for Taylor [name], Taylor [name] (MRN [id number]) as of 05/22/2019 14:05  Ref. Range 04/06/2019 14:43 04/06/2019 15:02  Glutamic Acid Decarb Ab Latest Ref Range: <5 IU/mL >  250 (H)   Islet Cell Ab Titer Latest Units: JDF units  1,280 (H)    ASSESSMENT / PLAN / RECOMMENDATIONS:   1) Latent Autoimmune Diabetes Mellitus, Optimally Controlled  - Most recent A1c of 6.4 %. Goal A1c < 7.0%   - A1c continues to be optimal but in review of her pump download she has been noted with tight Bg's and hypoglycemic episodes in the morning, will make the following changes to the basal rate    MEDICATIONS: Humalog   T-Slim Pump setting   Pump   T-Slim   Settings   Insulin type   Humalog    Basal rate       0000 0.150   0100 0.150   0600 0.125   0800 0.136    2200 0.150      I:C ratio       0000-0000  1:13          AIT  5 hrs      Sensitivity       0000  80      Goal       0000  80-120      EDUCATION / INSTRUCTIONS: BG monitoring instructions: Patient is instructed to check her blood sugars 4 times a day Call [facility] if: BG persistently < 70  I reviewed the Rule of 15 for the treatment of hypoglycemia in detail with the patient. Literature supplied.   2. Thyroid Nodule :    - No local neck symptoms  - She is s/p benign FNA of the right dominant thyroid nodule in 2016.  - Will re-order thyroid ultrasound    3. Dyslipidemia    - Will increase atorvastatin as below   Medication  Atorvastatin 20 mg daily   F/U in 6 months    Signed electronically by: Mack Guise, MD  [facility]  [address] [place], [place] [address] Phone: [phone] FAX: [phone]   CC: Shelda Pal, [address] [place]  [address] Phone: [phone]  Fax: [phone]  Return to Endocrinology clinic as below: Future Appointments  Date Time Provider [place]  06/27/2021  2:20 PM [facility] DIAG [place] 1 [facility]  06/27/2021  2:30 PM [facility] [place] 1 [facility]  10/04/2021  1:30 PM [place], [place]by [place]yster, DO [id number]

## 2021-04-06 ENCOUNTER — Other Ambulatory Visit: Payer: Self-pay

## 2021-04-06 DIAGNOSIS — K146 Glossodynia: Secondary | ICD-10-CM

## 2021-04-06 MED ORDER — LIDOCAINE VISCOUS HCL 2 % MT SOLN: 10.0000 mL | OROMUCOSAL | 1 refills | Status: AC | PRN

## 2021-04-11 ENCOUNTER — Ambulatory Visit (HOSPITAL_BASED_OUTPATIENT_CLINIC_OR_DEPARTMENT_OTHER)
Admission: RE | Admit: 2021-04-11 | Discharge: 2021-04-11 | Disposition: A | Discharge status: Home / Self Care | Payer: BC Managed Care – PPO | Source: Ambulatory Visit | Attending: Internal Medicine | Admitting: Internal Medicine

## 2021-04-11 ENCOUNTER — Other Ambulatory Visit: Payer: Self-pay

## 2021-04-11 DIAGNOSIS — E041 Nontoxic single thyroid nodule: Secondary | ICD-10-CM | POA: Insufficient documentation

## 2021-04-12 ENCOUNTER — Encounter: Payer: Self-pay | Admitting: Internal Medicine

## 2021-04-19 ENCOUNTER — Ambulatory Visit: Payer: BC Managed Care – PPO | Admitting: Orthopaedic Surgery

## 2021-04-19 ENCOUNTER — Other Ambulatory Visit: Payer: Self-pay

## 2021-04-19 ENCOUNTER — Encounter: Payer: Self-pay | Admitting: Orthopaedic Surgery

## 2021-04-19 VITALS — Ht 62.0 in | Wt 141.0 lb

## 2021-04-19 DIAGNOSIS — M25511 Pain in right shoulder: Secondary | ICD-10-CM

## 2021-04-19 DIAGNOSIS — G8929 Other chronic pain: Secondary | ICD-10-CM

## 2021-04-19 DIAGNOSIS — M25512 Pain in left shoulder: Secondary | ICD-10-CM | POA: Diagnosis not present

## 2021-04-19 MED ORDER — METHYLPREDNISOLONE ACETATE 40 MG/ML IJ SUSP
60.0000 mg | INTRAMUSCULAR | Status: AC | PRN
  Administered 2021-04-19: 60 mg via INTRA_ARTICULAR

## 2021-04-19 MED ORDER — LIDOCAINE HCL 2 % IJ SOLN
2.0000 mL | INTRAMUSCULAR | Status: AC | PRN
  Administered 2021-04-19: 2 mL

## 2021-04-19 MED ORDER — LIDOCAINE HCL 2 % IJ SOLN
1.0000 mg | INTRAMUSCULAR | Status: AC | PRN
  Administered 2021-04-19: 1 mg

## 2021-04-19 MED ORDER — METHYLPREDNISOLONE ACETATE 40 MG/ML IJ SUSP
20.0000 mg | INTRAMUSCULAR | Status: AC | PRN
  Administered 2021-04-19: 20 mg via INTRAMUSCULAR

## 2021-04-19 MED ORDER — BUPIVACAINE HCL 0.25 % IJ SOLN
2.0000 mL | INTRAMUSCULAR | Status: AC | PRN
  Administered 2021-04-19: 2 mL via INTRA_ARTICULAR

## 2021-04-19 NOTE — Progress Notes (Signed)
Office Visit Note   Patient: Taylor [name]           Date of Birth: [birth year]           MRN: [id number] Visit Date: 04/19/2021              Requested by: Sharlene Dory, DO [address] [place],  [place] [address] PCP: Sharlene Dory, DO   Assessment & Plan: Visit Diagnoses:  1. Chronic pain of both shoulders     Plan: Recurrent right shoulder pain that is responded to subacromial cortisone injections in the past.  Has mild impingement testing.  We will repeat subacromial cortisone injection as it has really helped in the past.  In addition Taylor [name] has been experiencing some local trigger point tenderness in the posterior left cervical musculature.  Will inject this area of trigger point tenderness with Depo-Medrol as well  Follow-Up Instructions: Return if symptoms worsen or fail to improve.   Orders:  No orders of the defined types were placed in this encounter.  No orders of the defined types were placed in this encounter.     Procedures: Large Joint Inj: R subacromial bursa on 04/19/2021 3:14 PM Indications: pain and diagnostic evaluation Details: 25 G 1.5 in needle, anterolateral approach  Arthrogram: No  Medications: 60 mg methylPREDNISolone acetate 40 MG/ML; 2 mL lidocaine 2 %; 2 mL bupivacaine 0.25 % Consent was given by the patient. Immediately prior to procedure a time out was called to verify the correct patient, procedure, equipment, support staff and site/side marked as required. Patient was prepped and draped in the usual sterile fashion.    Trigger Point Inj  Date/Time: 04/19/2021 3:15 PM Performed by: Valeria Batman, MD Authorized by: Valeria Batman, MD   Consent Given by:  Patient Indications:  Muscle spasm Total # of Trigger Points:  1 Location: neck   Needle Size:  27 G Approach:  Dorsal Medications #1:  1 mg lidocaine 2 %; 20 mg methylPREDNISolone acetate 40 MG/ML   Clinical Data: No additional  findings.   Subjective: Chief Complaint  Patient presents with   Right Shoulder - Pain  Patient presents today for her right shoulder. She had it last injected in December of 2021. She states that her pain started to return over the summer and progressively gotten worse. She is wanting to get another injection today.  Also relates having some local trigger point tenderness and muscle spasm in the posterior left cervical musculature.  HPI  Review of Systems   Objective: Vital Signs: Ht 5\' 2"  (1.575 m)   Wt 141 lb (64 kg)   BMI 25.79 kg/m   Physical Exam Constitutional:      Appearance: She is well-developed.  Pulmonary:     Effort: Pulmonary effort is normal.  Skin:    General: Skin is warm and dry.  Neurological:     Mental Status: She is alert and oriented to person, place, and time.  Psychiatric:        Behavior: Behavior normal.    Ortho Exam right shoulder with full overhead motion but with a circuitous arc of motion.  Positive impingement.  Some tenderness along the anterior lateral subacromial region particularly with abduction.  Area of trigger point tenderness and muscle spasm of the posterior cervical musculature on the left.  No masses  Specialty Comments:  No specialty comments available.  Imaging: No results found.   PMFS History: Patient Active Problem  List   Diagnosis Date Noted   Dyslipidemia 04/04/2021   LADA (latent autoimmune diabetes in adults), managed as type 1 (HCC) 07/26/2020   Hormone replacement therapy 07/26/2020   Thyroid nodule 07/26/2020   Postmenopause 03/23/2020   Vitamin D insufficiency 10/28/2019   Tingling in extremities 10/27/2019   Epigastric pain 10/05/2019   Bilateral shoulder pain 09/03/2019   Latent autoimmune diabetes in adults (LADA), managed as type 1 (HCC) 05/22/2019   Uncontrolled type 1 diabetes mellitus with hyperglycemia (HCC) 04/28/2019   Diabetes mellitus type 2 in nonobese (HCC) 02/14/2019   Chronic  nonintractable headache 05/14/2018   Gait instability 05/14/2018   Gastroparesis 02/12/2018   Essential hypertension, benign 04/18/2014   Irritable bowel syndrome with constipation 11/15/2013   Loss of weight 11/15/2013   Acute upper respiratory infections of unspecified site 11/15/2013   Post menopausal problems 11/15/2013   GERD (gastroesophageal reflux disease) 03/23/2013   Cerumen impaction 03/23/2013   Unspecified constipation 03/23/2013   Insomnia 03/23/2013   Mixed hyperlipidemia 03/23/2013   Palpitations 03/23/2013   Past Medical History:  Diagnosis Date   Anxiety    Colon polyp    Gastroparesis    GERD (gastroesophageal reflux disease)    Hyperlipidemia    Lupus (HCC)    Migraine headache    Osteoarthritis    Premature atrial contractions    Raynaud's phenomenon    Symptomatic menopausal or female climacteric states     Family History  Problem Relation Age of Onset   CVA Mother    Alzheimer's disease Mother    Heart disease Father    Diabetes Father    Hypertension Father    Hyperlipidemia Father    CVA Brother    Cancer Maternal Aunt        Ovarian Cancer   Cancer Maternal Uncle        Colon Cancer    Past Surgical History:  Procedure Laterality Date   CHOLECYSTECTOMY     HEMORRHOID SURGERY     HIP PINNING     LAPAROSCOPY     NASAL SINUS SURGERY     TOTAL KNEE ARTHROPLASTY     left knee   WRIST SURGERY     Left wrist   Social History   Occupational History   Occupation: HOMEMAKER  Tobacco Use   Smoking status: Never   Smokeless tobacco: Never  Substance and Sexual Activity   Alcohol use: No   Drug use: No   Sexual activity: Not on file

## 2021-05-25 DIAGNOSIS — M961 Postlaminectomy syndrome, not elsewhere classified: Secondary | ICD-10-CM | POA: Diagnosis not present

## 2021-05-25 DIAGNOSIS — M4802 Spinal stenosis, cervical region: Secondary | ICD-10-CM | POA: Diagnosis not present

## 2021-05-25 DIAGNOSIS — M47812 Spondylosis without myelopathy or radiculopathy, cervical region: Secondary | ICD-10-CM | POA: Diagnosis not present

## 2021-05-25 DIAGNOSIS — M5030 Other cervical disc degeneration, unspecified cervical region: Secondary | ICD-10-CM | POA: Diagnosis not present

## 2021-06-06 DIAGNOSIS — E108 Type 1 diabetes mellitus with unspecified complications: Secondary | ICD-10-CM | POA: Diagnosis not present

## 2021-06-06 DIAGNOSIS — Z794 Long term (current) use of insulin: Secondary | ICD-10-CM | POA: Diagnosis not present

## 2021-06-16 ENCOUNTER — Ambulatory Visit: Payer: BC Managed Care – PPO | Admitting: Family Medicine

## 2021-06-19 ENCOUNTER — Other Ambulatory Visit: Payer: Self-pay

## 2021-06-19 ENCOUNTER — Ambulatory Visit: Payer: BC Managed Care – PPO | Admitting: Family Medicine

## 2021-06-19 ENCOUNTER — Encounter: Payer: Self-pay | Admitting: Family Medicine

## 2021-06-19 ENCOUNTER — Telehealth: Payer: Self-pay | Admitting: Family Medicine

## 2021-06-19 VITALS — BP 110/62 | HR 66 | Temp 98.2°F | Ht 62.0 in | Wt 136.0 lb

## 2021-06-19 DIAGNOSIS — R6889 Other general symptoms and signs: Secondary | ICD-10-CM

## 2021-06-19 DIAGNOSIS — R61 Generalized hyperhidrosis: Secondary | ICD-10-CM | POA: Diagnosis not present

## 2021-06-19 DIAGNOSIS — G8929 Other chronic pain: Secondary | ICD-10-CM

## 2021-06-19 DIAGNOSIS — K3184 Gastroparesis: Secondary | ICD-10-CM | POA: Diagnosis not present

## 2021-06-19 DIAGNOSIS — R109 Unspecified abdominal pain: Secondary | ICD-10-CM | POA: Diagnosis not present

## 2021-06-19 MED ORDER — CISAPRIDE POWD: 0 refills | Status: DC

## 2021-06-19 NOTE — Patient Instructions (Addendum)
Stay hydrated.  Consider Metamucil or Benefiber daily after 4-5 days on the new powder. After another 4-5 days, consider a laxative every 3rd day.   If we are still having issues after 2.5-3 weeks, send me a message and I will order a scan.   Give [place] 2-3 business days to get the results of your labs back.   Let us know if you need anything.

## 2021-06-19 NOTE — Addendum Note (Signed)
Addended by: Radene Gunning on: 06/19/2021 04:34 PM   Modules accepted: Orders

## 2021-06-19 NOTE — Progress Notes (Addendum)
Chief Complaint  Patient presents with   GI problems    Subjective: Patient is a 65 y.o. female here for f/u gastroparesis.  Hx of gastroparesis, got better w insulin for her DM. She is going 1x/2 weeks. She has not been eating as much due to bloating/fullness. She had an episode 1 mo ago where she had extreme epigastric pain followed by diarrhea. She is passing gas. Appetite is poor. Not noticing many bowel sounds. Was taking Dulcolax prior to improving sugars that was helpful.   Past Medical History:  Diagnosis Date   Anxiety    Colon polyp    Gastroparesis    GERD (gastroesophageal reflux disease)    Hyperlipidemia    Lupus (HCC)    Migraine headache    Osteoarthritis    Premature atrial contractions    Raynaud's phenomenon    Symptomatic menopausal or female climacteric states     Objective: BP 110/62   Pulse 66   Temp 98.2 F (36.8 C) (Oral)   Ht 5\' 2"  (1.575 m)   Wt 136 lb (61.7 kg)   SpO2 99%   BMI 24.87 kg/m  General: Awake, appears stated age HEENT: MMM, EOMi Heart: RRR, no murmurs Lungs: CTAB, no rales, wheezes or rhonchi. No accessory muscle use Abd: BS hypoactive, S, ND, diffusely ttp, no masses or organomegaly Psych: Age appropriate judgment and insight, normal affect and mood  Assessment and Plan: Gastroparesis  Cold sweat - Plan: CBC, TSH  Chronic abdominal pain  Worsening of chronic issue. Would like her to follow up w endo given elevated sugars. Stay hydrated. Add cisapride qid before meals. Will reach out to GI to be placed on waiting list. I will reach out to colleagues for further advice in meanwhile also. If no improvement over next few weeks, I will order a CT abd/pelv to r/o intra-abd pathosis. Consider daily fiber supp. Will ck labs given cold sweats not always a/w sugar abn or pain.  The patient voiced understanding and agreement to the plan.  Addendum: Cisapride no longer sold. Will have her take IBGard and order a repeat gastric emptying  study. If no change from 2019 study, will tx for functional bloating. Hopefully she can get in sooner with her own GI.   I spent 45 min with the patient discussing the above and reviewing her chart/interacting with specialists on the same day of the visit.   [place], DO 06/19/21  3:36 PM

## 2021-06-19 NOTE — Telephone Encounter (Signed)
Pharmacy called to state  Cisapride POWD   is no longer sold in the [place], and it will need to be changed to a different rx.   [id number] - [place], [place] - [address] AT [facility] OF [address]  [address], [place]  [address]  Phone:  [phone]  Fax:  [phone]

## 2021-06-20 DIAGNOSIS — M4802 Spinal stenosis, cervical region: Secondary | ICD-10-CM | POA: Diagnosis not present

## 2021-06-20 DIAGNOSIS — M5030 Other cervical disc degeneration, unspecified cervical region: Secondary | ICD-10-CM | POA: Diagnosis not present

## 2021-06-20 DIAGNOSIS — M961 Postlaminectomy syndrome, not elsewhere classified: Secondary | ICD-10-CM | POA: Diagnosis not present

## 2021-06-20 DIAGNOSIS — M797 Fibromyalgia: Secondary | ICD-10-CM | POA: Diagnosis not present

## 2021-06-20 DIAGNOSIS — M47812 Spondylosis without myelopathy or radiculopathy, cervical region: Secondary | ICD-10-CM | POA: Diagnosis not present

## 2021-06-20 LAB — CBC
HCT: 37.4 % (ref 36.0–46.0)
Hemoglobin: 12.2 g/dL (ref 12.0–15.0)
MCHC: 32.4 g/dL (ref 30.0–36.0)
MCV: 90.9 fl (ref 78.0–100.0)
Platelets: 249 10*3/uL (ref 150.0–400.0)
RBC: 4.12 Mil/uL (ref 3.87–5.11)
RDW: 13.6 % (ref 11.5–15.5)
WBC: 4.7 10*3/uL (ref 4.0–10.5)

## 2021-06-20 LAB — TSH: TSH: 1.02 u[IU]/mL (ref 0.35–5.50)

## 2021-06-26 ENCOUNTER — Other Ambulatory Visit: Payer: Self-pay

## 2021-06-26 ENCOUNTER — Ambulatory Visit (HOSPITAL_COMMUNITY)
Admission: RE | Admit: 2021-06-26 | Discharge: 2021-06-26 | Disposition: A | Discharge status: Home / Self Care | Payer: BC Managed Care – PPO | Source: Ambulatory Visit | Attending: Family Medicine | Admitting: Family Medicine

## 2021-06-26 DIAGNOSIS — K3184 Gastroparesis: Secondary | ICD-10-CM | POA: Diagnosis not present

## 2021-06-26 DIAGNOSIS — Z0389 Encounter for observation for other suspected diseases and conditions ruled out: Secondary | ICD-10-CM | POA: Diagnosis not present

## 2021-06-26 MED ORDER — TECHNETIUM TC 99M SULFUR COLLOID
2.2000 | Freq: Once | INTRAVENOUS | Status: AC | PRN
  Administered 2021-06-26: 2.2 via INTRAVENOUS

## 2021-06-27 ENCOUNTER — Ambulatory Visit: Payer: BC Managed Care – PPO

## 2021-06-27 ENCOUNTER — Ambulatory Visit
Admission: RE | Admit: 2021-06-27 | Discharge: 2021-06-27 | Disposition: A | Discharge status: Home / Self Care | Payer: BC Managed Care – PPO | Source: Ambulatory Visit | Attending: Family Medicine | Admitting: Family Medicine

## 2021-06-27 ENCOUNTER — Other Ambulatory Visit: Payer: Self-pay | Admitting: Family Medicine

## 2021-06-27 DIAGNOSIS — N6489 Other specified disorders of breast: Secondary | ICD-10-CM

## 2021-06-27 DIAGNOSIS — R922 Inconclusive mammogram: Secondary | ICD-10-CM | POA: Diagnosis not present

## 2021-07-04 DIAGNOSIS — Z7409 Other reduced mobility: Secondary | ICD-10-CM | POA: Diagnosis not present

## 2021-07-04 DIAGNOSIS — M6289 Other specified disorders of muscle: Secondary | ICD-10-CM | POA: Diagnosis not present

## 2021-07-04 DIAGNOSIS — R29898 Other symptoms and signs involving the musculoskeletal system: Secondary | ICD-10-CM | POA: Diagnosis not present

## 2021-07-04 DIAGNOSIS — M47812 Spondylosis without myelopathy or radiculopathy, cervical region: Secondary | ICD-10-CM | POA: Diagnosis not present

## 2021-07-11 DIAGNOSIS — Z7409 Other reduced mobility: Secondary | ICD-10-CM | POA: Diagnosis not present

## 2021-07-11 DIAGNOSIS — R29898 Other symptoms and signs involving the musculoskeletal system: Secondary | ICD-10-CM | POA: Diagnosis not present

## 2021-07-11 DIAGNOSIS — M47812 Spondylosis without myelopathy or radiculopathy, cervical region: Secondary | ICD-10-CM | POA: Diagnosis not present

## 2021-07-11 DIAGNOSIS — M542 Cervicalgia: Secondary | ICD-10-CM | POA: Diagnosis not present

## 2021-07-26 DIAGNOSIS — M542 Cervicalgia: Secondary | ICD-10-CM | POA: Diagnosis not present

## 2021-07-26 DIAGNOSIS — M47812 Spondylosis without myelopathy or radiculopathy, cervical region: Secondary | ICD-10-CM | POA: Diagnosis not present

## 2021-07-26 DIAGNOSIS — Z7409 Other reduced mobility: Secondary | ICD-10-CM | POA: Diagnosis not present

## 2021-07-26 DIAGNOSIS — R29898 Other symptoms and signs involving the musculoskeletal system: Secondary | ICD-10-CM | POA: Diagnosis not present

## 2021-07-27 ENCOUNTER — Other Ambulatory Visit: Payer: Self-pay

## 2021-07-27 ENCOUNTER — Encounter: Payer: Self-pay | Admitting: Orthopaedic Surgery

## 2021-07-27 ENCOUNTER — Ambulatory Visit: Payer: BC Managed Care – PPO | Admitting: Orthopaedic Surgery

## 2021-07-27 DIAGNOSIS — M25511 Pain in right shoulder: Secondary | ICD-10-CM | POA: Diagnosis not present

## 2021-07-27 DIAGNOSIS — G8929 Other chronic pain: Secondary | ICD-10-CM

## 2021-07-27 MED ORDER — LIDOCAINE HCL 1 % IJ SOLN
5.0000 mL | INTRAMUSCULAR | Status: AC | PRN
  Administered 2021-07-27: 5 mL

## 2021-07-27 MED ORDER — METHYLPREDNISOLONE ACETATE 40 MG/ML IJ SUSP
40.0000 mg | INTRAMUSCULAR | Status: AC | PRN
  Administered 2021-07-27: 40 mg via INTRA_ARTICULAR

## 2021-07-27 NOTE — Progress Notes (Signed)
Office Visit Note   Patient: Taylor [name]           Date of Birth: 11-13-1955           MRN: [id number] Visit Date: 07/27/2021              Requested by: Sharlene Dory, DO [address] [place],  [place] [address] PCP: Sharlene Dory, DO   Assessment & Plan: Visit Diagnoses: No diagnosis found.  Plan: 65 year old woman with a history of right shoulder pain calcific tendinitis by previous x-rays.   She has had good success with subacromial injection in the past.  I did raise her blood sugars as she is a diabetic.  She would like to have another injection today her last injection was over 3 months ago.  She also has some aching in her biceps muscle.  If she has short term relief from her injection we would go forward with an MRI of her shoulder to evaluate for rotator cuff tear.  She will call us if she wishes to go forward with this  Follow-Up Instructions: No follow-ups on file.   Orders:  No orders of the defined types were placed in this encounter.  No orders of the defined types were placed in this encounter.     Procedures: Large Joint Inj: R subacromial bursa on 07/27/2021 2:59 PM Indications: diagnostic evaluation and pain Details: 25 G 1.5 in needle, anterior approach  Arthrogram: No  Medications: 5 mL lidocaine 1 %; 40 mg methylPREDNISolone acetate 40 MG/ML Outcome: tolerated well, no immediate complications Procedure, treatment alternatives, risks and benefits explained, specific risks discussed. Consent was given by the patient.      Clinical Data: No additional findings.   Subjective: Chief Complaint  Patient presents with   Right Shoulder - Pain  Patient presents today for recurrent right shoulder pain. She received a cortisone injection in her shoulder in August of this year. She said that it helps and wants another one today. She is diabetic. She is also going to physical therapy and pain management for her neck pain. She  currently takes celebrex, Belbuca, and oxycodone as needed for pain.     Review of Systems  All other systems reviewed and are negative.   Objective: Vital Signs: There were no vitals taken for this visit.  Physical Exam Constitutional:      Appearance: Normal appearance.  Pulmonary:     Effort: Pulmonary effort is normal.  Skin:    General: Skin is warm and dry.  Neurological:     General: No focal deficit present.     Mental Status: She is alert and oriented to person, place, and time.    Ortho Exam Examination of her right shoulder.  She does have some pain with forward elevation but has almost equivalent to the other side.  She does have some difficulty with internal rotation behind her back.  She has some pain in the anterior shoulder that radiates down her biceps.  Distal biceps is intact.  She does have a history of calcific tendinitis of the supraspinatus and she does have some tenderness in this area as well.  Strength distally is intact.  She has some mild impingement findings Specialty Comments:  No specialty comments available.  Imaging: No results found.   PMFS History: Patient Active Problem List   Diagnosis Date Noted   Dyslipidemia 04/04/2021   LADA (latent autoimmune diabetes in adults), managed as type 1 (  HCC) 07/26/2020   Hormone replacement therapy 07/26/2020   Thyroid nodule 07/26/2020   Postmenopause 03/23/2020   Vitamin D insufficiency 10/28/2019   Tingling in extremities 10/27/2019   Epigastric pain 10/05/2019   Bilateral shoulder pain 09/03/2019   Latent autoimmune diabetes in adults (LADA), managed as type 1 (HCC) 05/22/2019   Uncontrolled type 1 diabetes mellitus with hyperglycemia (HCC) 04/28/2019   Diabetes mellitus type 2 in nonobese (HCC) 02/14/2019   Chronic nonintractable headache 05/14/2018   Gait instability 05/14/2018   Gastroparesis 02/12/2018   Essential hypertension, benign 04/18/2014   Irritable bowel syndrome with  constipation 11/15/2013   Loss of weight 11/15/2013   Acute upper respiratory infections of unspecified site 11/15/2013   Post menopausal problems 11/15/2013   GERD (gastroesophageal reflux disease) 03/23/2013   Cerumen impaction 03/23/2013   Unspecified constipation 03/23/2013   Insomnia 03/23/2013   Mixed hyperlipidemia 03/23/2013   Palpitations 03/23/2013   Past Medical History:  Diagnosis Date   Anxiety    Colon polyp    Gastroparesis    GERD (gastroesophageal reflux disease)    Hyperlipidemia    Lupus (HCC)    Migraine headache    Osteoarthritis    Premature atrial contractions    Raynaud's phenomenon    Symptomatic menopausal or female climacteric states     Family History  Problem Relation Age of Onset   CVA Mother    Alzheimer's disease Mother    Heart disease Father    Diabetes Father    Hypertension Father    Hyperlipidemia Father    CVA Brother    Cancer Maternal Aunt        Ovarian Cancer   Cancer Maternal Uncle        Colon Cancer    Past Surgical History:  Procedure Laterality Date   CHOLECYSTECTOMY     HEMORRHOID SURGERY     HIP PINNING     LAPAROSCOPY     NASAL SINUS SURGERY     TOTAL KNEE ARTHROPLASTY     left knee   WRIST SURGERY     Left wrist   Social History   Occupational History   Occupation: HOMEMAKER  Tobacco Use   Smoking status: Never   Smokeless tobacco: Never  Substance and Sexual Activity   Alcohol use: No   Drug use: No   Sexual activity: Not on file

## 2021-07-31 DIAGNOSIS — Z7409 Other reduced mobility: Secondary | ICD-10-CM | POA: Diagnosis not present

## 2021-07-31 DIAGNOSIS — M542 Cervicalgia: Secondary | ICD-10-CM | POA: Diagnosis not present

## 2021-07-31 DIAGNOSIS — R29898 Other symptoms and signs involving the musculoskeletal system: Secondary | ICD-10-CM | POA: Diagnosis not present

## 2021-07-31 DIAGNOSIS — M47812 Spondylosis without myelopathy or radiculopathy, cervical region: Secondary | ICD-10-CM | POA: Diagnosis not present

## 2021-08-03 ENCOUNTER — Telehealth: Payer: Self-pay | Admitting: Family Medicine

## 2021-08-03 ENCOUNTER — Telehealth: Payer: Self-pay

## 2021-08-03 ENCOUNTER — Encounter: Payer: Self-pay | Admitting: Internal Medicine

## 2021-08-03 NOTE — Telephone Encounter (Signed)
Spoke with triage nurse she wanted pt to been within 4 hrs but we had nothing at our office. Triage nurse states that she will let her know to go to the ER  Triage nurse called back and stated that the pt refuse to go to the ER. She states that pt has a possible bile obstruction.

## 2021-08-03 NOTE — Telephone Encounter (Signed)
Pt stated she was having abdominal pain and also her insulin pump my be off since her sugar levels were very high. She wanted to be seen by [facility] to adjust her insulin pump and was given first available in this office. Also gave her [place] location's number because she was unsure if she could wait that long. Transferred her to triage to go over her symptoms. Please advise.

## 2021-08-03 NOTE — Telephone Encounter (Signed)
Pt would not go the Er or urgent care. Pt states that she is not experiencing any diarrhea or vomiting. She is hydrated because she is able to drink but limited on eating. She states that she was experiencing the diarrhea and vomiting Monday but it has stopped. Pt would like to be seen by pcp.

## 2021-08-04 ENCOUNTER — Encounter: Payer: Self-pay | Admitting: Family Medicine

## 2021-08-04 ENCOUNTER — Other Ambulatory Visit: Payer: Self-pay

## 2021-08-04 ENCOUNTER — Ambulatory Visit (INDEPENDENT_AMBULATORY_CARE_PROVIDER_SITE_OTHER): Payer: BC Managed Care – PPO | Admitting: Internal Medicine

## 2021-08-04 ENCOUNTER — Other Ambulatory Visit: Payer: Self-pay | Admitting: Family Medicine

## 2021-08-04 VITALS — BP 122/80 | HR 62 | Ht 62.0 in | Wt 133.0 lb

## 2021-08-04 DIAGNOSIS — R1084 Generalized abdominal pain: Secondary | ICD-10-CM

## 2021-08-04 DIAGNOSIS — E139 Other specified diabetes mellitus without complications: Secondary | ICD-10-CM

## 2021-08-04 DIAGNOSIS — R739 Hyperglycemia, unspecified: Secondary | ICD-10-CM

## 2021-08-04 DIAGNOSIS — K3184 Gastroparesis: Secondary | ICD-10-CM

## 2021-08-04 LAB — LIPASE: Lipase: 6 U/L — ABNORMAL LOW (ref 11.0–59.0)

## 2021-08-04 LAB — BASIC METABOLIC PANEL
BUN: 9 mg/dL (ref 6–23)
CO2: 34 mEq/L — ABNORMAL HIGH (ref 19–32)
Calcium: 9.6 mg/dL (ref 8.4–10.5)
Chloride: 98 mEq/L (ref 96–112)
Creatinine, Ser: 0.78 mg/dL (ref 0.40–1.20)
GFR: 80.38 mL/min (ref >60.00)
Glucose, Bld: 220 mg/dL — ABNORMAL HIGH (ref 70–99)
Potassium: 5 mEq/L (ref 3.5–5.1)
Sodium: 137 mEq/L (ref 135–145)

## 2021-08-04 LAB — POCT GLYCOSYLATED HEMOGLOBIN (HGB A1C): Hemoglobin A1C: 6.7 % — AB (ref 4.0–5.6)

## 2021-08-04 NOTE — Telephone Encounter (Signed)
Nurse Assessment Nurse: [name], RN, [name]: 08/03/2021 1:34:48 PM Confirm and document reason for call. If symptomatic, describe symptoms. ---Caller states she experienced a partial blockage on Monday night, vomited clear fluids and had leakage of diarrhea. States she feels full, doesn't want to eat anything, has been drinking fluids but has chills in her back and extreme nausea. States she really only has a BM if she takes laxatives, last BM was 7-10 days ago other than the small amount of diarrhea Monday night. Does the patient have any new or worsening symptoms? ---Yes Will a triage be completed? ---Yes Related visit to physician within the last 2 weeks? ---No Does the PT have any chronic conditions? (i.e. diabetes, asthma, this includes High risk factors for pregnancy, etc.) ---Yes List chronic conditions. ---Diabetes, Constipation, lupus Is this a behavioral health or substance abuse call? ---No Guidelines Guideline Title Affirmed Question Affirmed Notes Nurse Date/Time (Eastern Time) Constipation [1] Vomiting AND [2] abdomen looks much more swollen than usual Taylor [name] 08/03/2021 1:37:56 PM PLEASE NOTE: All timestamps contained within this report are represented as [name]. CONFIDENTIALTY NOTICE: This fax transmission is intended only for the addressee. It contains information that is legally privileged, confidential or otherwise protected from use or disclosure. If you are not the intended recipient, you are strictly prohibited from reviewing, disclosing, copying using or disseminating any of this information or taking any action in reliance on or regarding this information. If you have received this fax in error, please notify us immediately by telephone so that we can arrange for its return to [place]. Phone: [phone], Toll-Free: [phone], Fax: [phone] Page: 2 of 2 Call Id: [id number] Disp. Time Taylor [name] Time) Disposition  Final User 08/03/2021 1:39:33 PM See HCP within 4 Hours (or PCP triage) Yes [name], RN, Taylor [name] Disagree/Comply Disagree Caller Understands Yes PreDisposition Call Doctor Care Advice Given Per Guideline SEE HCP (OR PCP TRIAGE) WITHIN 4 HOURS: * IF OFFICE WILL BE OPEN: You need to be seen within the next 3 or 4 hours. Call your doctor (or NP/PA) now or as soon as the office opens. NOTHING BY MOUTH: * Do not eat or drink anything for now. CALL BACK IF: * You become worse CARE ADVICE given per Constipation (Adult) guideline. Comments User: Taylor Shuck, RN Date/Time Taylor [name] Time): 08/03/2021 1:51:03 PM Nurse called backline and spoke with Taylor [name], she states no appts available within next 4 hrs. Caller informed and advised to go to ER or UC and she refuses, states she is frustrated because she can never get into her doctor and that's who she wants to see. Caller is informed of risks of having untreated partial or full bowel obstruction i.e. bowel rupture, peritonitis, sepsis, etc and she states she has dealt with this a long time, husband is a neurologist so he will help her. Nurse called backline again and informed Taylor [name] that pt refused outcome, she states she will call pt. Referrals GO TO FACILITY REFUSED

## 2021-08-04 NOTE — Telephone Encounter (Signed)
Hyperglycemia most likely related to methylprednisolone received 12/8  Will address today 8:50 AM

## 2021-08-04 NOTE — Progress Notes (Signed)
Name: Taylor [name]  Age/ Sex: 65 y.o., female   MRN/ DOB: [id number], May 22, 1956     PCP: Shelda Pal, DO   Reason for Endocrinology Evaluation: Type 1 Diabetes Mellitus  Initial Endocrine Consultative Visit: 04/06/19    Taylor [name] IDENTIFIER: Taylor [name] is a 65 y.o. female with a past medical history of T1DM, SLE, GERD, Gastroparesis. The Taylor [name] has followed with Endocrinology clinic since 04/06/2019 for consultative assistance with management of her diabetes.      DIABETIC HISTORY:  Taylor [name] was diagnosed with T1DM in 01/2019. Her hemoglobin A1c was 11.1% on diagnosis. No DKA. Islet cell Ab's positive, consistent with L ADA. She was initially was put on an insulin mix but due to fluctuating BG's she was changed to an MDI regimen.  She was started on an insulin pump January 2021    Thyroid Nodule History :  She was found to have an incidental MNG on CT scan. A dominant right thyroid nodule of 1.6x0.9x1 cm was performed with benign cytology in 05/2015 Due to concerns of weight loss and feeling poorly a cortisol was checked by her endocrinologist in 05/2018 at 9.8 mcg/dL    HORMONE REPLACEMENT THERAPY: She has been on Hormonal replacement therapy for > 5 yrs. She has been menopausal since ~ the age of 88 .  Follows with Gyn   SUBJECTIVE:   During the last visit (04/04/2021): A1c 6.4 %. No changes to insulin pump settings.     Today (03/22/2020): Taylor [name] is here for a follow up on abdominal pain and hyperglycemia.  The Taylor [name] does state that she had intra-articular methylprednisolone injection 07/27/2021 .  She is here to mainly discuss her abdominal pain, of note the Taylor [name] has chronic intermittent abdominal pain with a prior diagnosis of gastroparesis (prior to DM diagnosis) in October she had an episode of severe abdominal pain that she believes is due to bowel obstruction  Earlier this week she had a severe episode of nausea and vomiting as well  as abdominal pain and believes this was acute pancreatitis.  Of note the Taylor [name] has no prior history of pancreatitis  Per Taylor [name] she contacted GI but were not able to see her until February 2023, contacted her PCP but was not able to be seen quick enough.  Per Taylor [name] I was the only one who was able to see her fast enough.   Taylor [name] has not sought medical attention, except her neighbor who is an ED physician checked her at home  She had declined ED/urgent referral yesterday when she contacted her PCPs office   Taylor [name] is requesting blood work as well as blood work to check on pancreatitis.  HOME DIABETES REGIMEN:  Humalog through [facility]     PUMP SETTINGS & DOWNLOAD   BASAL SETTINGS: Time  Basal Rate (units/ hr)   0000 0.150  0100 0.136  0600 0.125  0800 0.136  2200 0.150  Total 24-hour basal:  6.0  BOLUS SETTINGS: Insulin:Carb Ratio: 1:13 Sensitivity: 80 Target BG: 120 Active Insulin Time: 5 hours  CURRENT PUMP STATISTICS:7/20-03/21/2020 Average BG: 188 Average Daily Carbs (g): 233 Average Daily Basal: 6 (26 %) Average Daily Bolus: 18 (74%) Total daily insulin 23.53    DIABETIC COMPLICATIONS: Microvascular complications:    Denies: CKD, neuropathy, retinopathy  Last eye exam: Completed 07/13/2020   Macrovascular complications:    Denies: CAD, PVD, CVA  HISTORY:  Past Medical History:  Past Medical History:  Diagnosis Date  Anxiety    Colon polyp    Gastroparesis    GERD (gastroesophageal reflux disease)    Hyperlipidemia    Lupus (HCC)    Migraine headache    Osteoarthritis    Premature atrial contractions    Raynaud's phenomenon    Symptomatic menopausal or female climacteric states    Past Surgical History:  Past Surgical History:  Procedure Laterality Date   CHOLECYSTECTOMY     HEMORRHOID SURGERY     HIP PINNING     LAPAROSCOPY     NASAL SINUS SURGERY     TOTAL KNEE ARTHROPLASTY     left knee   WRIST SURGERY     Left wrist   Social  History:  reports that she has never smoked. She has never used smokeless tobacco. She reports that she does not drink alcohol and does not use drugs. Family History:  Family History  Problem Relation Age of Onset   CVA Mother    Alzheimer's disease Mother    Heart disease Father    Diabetes Father    Hypertension Father    Hyperlipidemia Father    CVA Brother    Cancer Maternal Aunt        Ovarian Cancer   Cancer Maternal Uncle        Colon Cancer     HOME MEDICATIONS: Allergies as of 08/04/2021       Reactions   Lyrica [pregabalin]    Phenergan [promethazine]    Prochlorperazine         Medication List        Accurate as of August 04, 2021  5:49 PM. If you have any questions, ask your nurse or doctor.          aspirin 81 MG tablet Take 81 mg by mouth daily.   atenolol 50 MG tablet Commonly known as: TENORMIN Take 1 tablet (50 mg total) by mouth daily.   atorvastatin 20 MG tablet Commonly known as: Lipitor Take 1 tablet (20 mg total) by mouth daily.   Buprenorphine HCl 450 MCG Film Place 1 patch inside cheek 2 (two) times daily.   celecoxib 200 MG capsule Commonly known as: CELEBREX Take 200 mg by mouth daily.   cholecalciferol 25 MCG (1000 UNIT) tablet Commonly known as: VITAMIN D3 Take 1,000 Units by mouth daily.   Cisapride Powd Take 5 mg by mouth 4 times daily prior to meals.   Dexcom G6 Receiver Devi 1 each by Does not apply route See admin instructions. For continuous glucose monitoring; E13.9   Dexcom G6 Sensor Misc 1 each by Does not apply route See admin instructions. For use with continuous glucose monitoring system. Change sensor every 10 days; E13.9   Dexcom G6 Transmitter Misc 1 each by Does not apply route See admin instructions. For continuous glucose monitoring; E13.9   docusate sodium 100 MG capsule Commonly known as: COLACE Take 100 mg by mouth 2 (two) times daily.   estradiol 0.05 mg/24hr patch Commonly known as:  Climara Place 1 patch (0.05 mg total) onto the skin once a week.   famotidine 40 MG tablet Commonly known as: PEPCID Take 40 mg by mouth daily.   FLUoxetine 40 MG capsule Commonly known as: PROZAC Take 40 mg by mouth daily.   Galcanezumab-gnlm 120 MG/ML Soaj Inject into the skin.   insulin lispro 100 UNIT/ML injection Commonly known as: HumaLOG Max daily 30 units through pump   Lancets Misc Use daily to check blood sugar  4  times daily Dx E11.9   lidocaine 2 % solution Commonly known as: XYLOCAINE Use as directed 10 mLs in the mouth or throat as needed for mouth pain.   loratadine 10 MG tablet Commonly known as: CLARITIN Take 10 mg by mouth daily.   melatonin 1 MG Tabs tablet Take by mouth.   multivitamin with minerals Tabs tablet Take 1 tablet by mouth daily.   NovoFine 32G X 6 MM Misc Generic drug: Insulin Pen Needle Use with insulin pen   BD Pen Needle Nano 2nd Gen 32G X 4 MM Misc Generic drug: Insulin Pen Needle Use as need for insulin 5 times daily Ell.9   omeprazole 20 MG capsule Commonly known as: PRILOSEC Take 1 capsule (20 mg total) by mouth 2 (two) times daily. What changed: how much to take   ONE TOUCH ULTRA 2 w/Device Kit Use once daily to check blood sugar..   DX E11.9   OneTouch Ultra test strip Generic drug: glucose blood Use once daily to check blood sugar five times daily.  DXE11.9   oxycodone 5 MG capsule Commonly known as: OXY-IR Take 5 mg by mouth every 4 (four) hours as needed.   progesterone 100 MG capsule Commonly known as: PROMETRIUM Take 1 capsule (100 mg total) by mouth as directed.   SUMAtriptan 20 MG/ACT nasal spray Commonly known as: IMITREX Place 1 spray into the nose every 2 (two) hours as needed for migraine.   T:slim Insulin Delivery System Devi by Does not apply route.         OBJECTIVE:   Vital Signs: BP 122/80 (BP Location: Left Arm, Taylor [name] Position: Sitting, Cuff Size: Small)    Pulse 62    Ht '5\' 2"'   (1.575 m)    Wt 133 lb (60.3 kg)    SpO2 99%    BMI 24.33 kg/m   Wt Readings from Last 3 Encounters:  08/04/21 133 lb (60.3 kg)  06/19/21 136 lb (61.7 kg)  04/19/21 141 lb (64 kg)     Exam: General: Pt appears well and is in NAD  Lungs: Clear with good BS bilat with no rales, rhonchi, or wheezes  Heart: RRR with normal  Abdomen:  Soft, no guarding  Extremities: No pretibial edema.   Neuro: MS is good with appropriate affect, pt is alert and Ox3    DM foot exam: 11/29/2020  The skin of the feet is intact without sores or ulcerations. The pedal pulses are 2+ on right and 2+ on left. The sensation is absent  to a screening 5.07, 10 gram monofilament bilaterally    DATA REVIEWED:  Lab Results  Component Value Date   HGBA1C 6.7 (A) 08/04/2021   HGBA1C 6.4 (A) 04/04/2021   HGBA1C 6.7 (A) 11/29/2020    Latest Reference Range & Units 08/04/21 09:20  Sodium 135 - 145 mEq/L 137  Potassium 3.5 - 5.1 mEq/L 5.0  Chloride 96 - 112 mEq/L 98  CO2 19 - 32 mEq/L 34 (H)  Glucose 70 - 99 mg/dL 220 (H)  BUN 6 - 23 mg/dL 9  Creatinine 0.40 - 1.20 mg/dL 0.78  Calcium 8.4 - 10.5 mg/dL 9.6  Lipase 11.0 - 59.0 U/L 6.0 (L)  GFR >60.00 mL/min 80.38    Results for Taylor [name], Taylor [name] (MRN [id number]) as of 05/22/2019 14:05  Ref. Range 04/06/2019 14:43 04/06/2019 15:02  Glutamic Acid Decarb Ab Latest Ref Range: <5 IU/mL >250 (H)   Islet Cell Ab Titer Latest Units: JDF units  1,280 (H)  ASSESSMENT / PLAN / RECOMMENDATIONS:   1) Abdominal pain:  -This seems to be her main reason for the visit today, she is requesting blood work and to check on pancreatitis -Of note the Taylor [name] has no prior history of pancreatitis  -I did explain to the Taylor [name] that management of pancreatitis is beyond the scope of endocrinology, we discussed usually with pancreatitis the Taylor [name] has to stay n.p.o. and would require inpatient management.  I also advised the Taylor [name] that in the future if she believes she has  pancreatitis she needs to report to the ED as I am not equipped to manage acute pancreatitis -Fortunately enough her lipase is not elevated on today's labs -Abdominal exam benign -We will defer further management to her gastroenterologist  2) Latent Autoimmune Diabetes Mellitus, Optimally Controlled  - Most recent A1c of 6.7 %. Goal A1c < 7.0%  -Her glycemic control is optimal, her A1c has not been above 7.0% in almost 2 years. -She does have occasional hyperglycemia as well as hypoglycemia which is not out of the ordinary and diabetes. -I am going to adjust her insulin to carb ratio, she has been noted with hypoglycemia postprandial but usually this normalizes after a few hours -She has no prior history of DKA, no concerns about this  MEDICATIONS: Humalog   T-Slim Pump setting   Pump   T-Slim   Settings   Insulin type   Humalog    Basal rate       0000 0.150   0100 0.136   0600 0.125   0800 0.136   2200 0.150      I:C ratio       0000-0000  1:12          AIT  5 hrs      Sensitivity       0000  80         EDUCATION / INSTRUCTIONS: BG monitoring instructions: Taylor [name] is instructed to check her blood sugars 4 times a day Call [facility] if: BG persistently < 70  I reviewed the Rule of 15 for the treatment of hypoglycemia in detail with the Taylor [name]. Literature supplied.       F/U in 6 months    Signed electronically by: Mack Guise, MD  [facility]  [facility] [address] [place], [place] [address] Phone: [phone] FAX: [phone]   CC: Shelda Pal, [address] [place] [address] Phone: [phone]  Fax: [phone]  Return to Endocrinology clinic as below: Future Appointments  Date Time Provider [place]  10/04/2021  1:30 PM Shelda Pal, DO [facility] [place]  12/05/2021  1:20 PM Audreana Hancox, Melanie Crazier, MD [facility]   01/05/2022  2:30 PM [facility]

## 2021-08-08 ENCOUNTER — Ambulatory Visit: Payer: BC Managed Care – PPO | Admitting: Internal Medicine

## 2021-08-18 DIAGNOSIS — M47812 Spondylosis without myelopathy or radiculopathy, cervical region: Secondary | ICD-10-CM | POA: Diagnosis not present

## 2021-08-18 DIAGNOSIS — M542 Cervicalgia: Secondary | ICD-10-CM | POA: Diagnosis not present

## 2021-08-18 DIAGNOSIS — Z7409 Other reduced mobility: Secondary | ICD-10-CM | POA: Diagnosis not present

## 2021-08-18 DIAGNOSIS — R29898 Other symptoms and signs involving the musculoskeletal system: Secondary | ICD-10-CM | POA: Diagnosis not present

## 2021-08-23 DIAGNOSIS — Z7409 Other reduced mobility: Secondary | ICD-10-CM | POA: Diagnosis not present

## 2021-08-23 DIAGNOSIS — M542 Cervicalgia: Secondary | ICD-10-CM | POA: Diagnosis not present

## 2021-08-23 DIAGNOSIS — M47812 Spondylosis without myelopathy or radiculopathy, cervical region: Secondary | ICD-10-CM | POA: Diagnosis not present

## 2021-08-23 DIAGNOSIS — R29898 Other symptoms and signs involving the musculoskeletal system: Secondary | ICD-10-CM | POA: Diagnosis not present

## 2021-08-24 ENCOUNTER — Other Ambulatory Visit (HOSPITAL_COMMUNITY): Payer: Self-pay

## 2021-08-24 ENCOUNTER — Telehealth: Payer: Self-pay

## 2021-08-24 ENCOUNTER — Telehealth: Payer: Self-pay | Admitting: Internal Medicine

## 2021-08-24 MED ORDER — INSULIN ASPART 100 UNIT/ML IJ SOLN: INTRAMUSCULAR | 6 refills | Status: DC

## 2021-08-24 NOTE — Telephone Encounter (Signed)
Patient states that Novolog needs PA

## 2021-08-24 NOTE — Telephone Encounter (Signed)
Per new insurance Norolog is the approved insulin that will be covered. Patient is requesting new prescription for this medication. [id number] - [place], [place] - [address] AT [facility] [address] Phone:  [phone]  Fax:  [phone]      Patient request a call back at [phone]. Please call back patient back ASAP.  Currently, patient is on insulin lispro (HUMALOG) 100 UNIT/ML injection but insurance will only cover Norolog.

## 2021-08-24 NOTE — Telephone Encounter (Signed)
My chart message sent advising patient that Novolog  has been sent

## 2021-08-25 NOTE — Telephone Encounter (Signed)
Patient was able to get medication. 

## 2021-08-29 DIAGNOSIS — E139 Other specified diabetes mellitus without complications: Secondary | ICD-10-CM | POA: Diagnosis not present

## 2021-08-29 DIAGNOSIS — E108 Type 1 diabetes mellitus with unspecified complications: Secondary | ICD-10-CM | POA: Diagnosis not present

## 2021-08-30 DIAGNOSIS — M47812 Spondylosis without myelopathy or radiculopathy, cervical region: Secondary | ICD-10-CM | POA: Diagnosis not present

## 2021-08-30 DIAGNOSIS — M5030 Other cervical disc degeneration, unspecified cervical region: Secondary | ICD-10-CM | POA: Diagnosis not present

## 2021-08-30 DIAGNOSIS — M797 Fibromyalgia: Secondary | ICD-10-CM | POA: Diagnosis not present

## 2021-08-30 DIAGNOSIS — M961 Postlaminectomy syndrome, not elsewhere classified: Secondary | ICD-10-CM | POA: Diagnosis not present

## 2021-10-04 ENCOUNTER — Encounter: Payer: BC Managed Care – PPO | Admitting: Family Medicine

## 2021-10-10 ENCOUNTER — Ambulatory Visit: Payer: BC Managed Care – PPO | Admitting: Internal Medicine

## 2021-10-10 ENCOUNTER — Ambulatory Visit (INDEPENDENT_AMBULATORY_CARE_PROVIDER_SITE_OTHER): Payer: BC Managed Care – PPO | Admitting: Family Medicine

## 2021-10-10 ENCOUNTER — Encounter: Payer: Self-pay | Admitting: Family Medicine

## 2021-10-10 VITALS — BP 120/72 | HR 58 | Temp 98.2°F | Resp 16 | Ht 62.0 in | Wt 134.6 lb

## 2021-10-10 DIAGNOSIS — Z0000 Encounter for general adult medical examination without abnormal findings: Secondary | ICD-10-CM | POA: Diagnosis not present

## 2021-10-10 DIAGNOSIS — G8929 Other chronic pain: Secondary | ICD-10-CM | POA: Diagnosis not present

## 2021-10-10 DIAGNOSIS — Z23 Encounter for immunization: Secondary | ICD-10-CM | POA: Diagnosis not present

## 2021-10-10 DIAGNOSIS — R109 Unspecified abdominal pain: Secondary | ICD-10-CM | POA: Diagnosis not present

## 2021-10-10 MED ORDER — PANCRELIPASE (LIP-PROT-AMYL) 36000-114000 UNITS PO CPEP: ORAL_CAPSULE | ORAL | 2 refills | Status: AC

## 2021-10-10 NOTE — [place] Notes (Signed)
Chief Complaint  Patient presents with   Annual Exam    Here for Annual Exam      Well Woman CATRICIA [name] is here for a complete physical.   Her last physical was >1 year ago.  Current diet: in general, diet is fair. Current exercise: cycling. Weight is stable and she denies fatigue out of ordinary. Seatbelt? Yes Advanced directive? Yes  Health Maintenance Pap/HPV- No Mammogram- Yes Colon cancer screening-Yes Shingrix- No Tetanus- Due Hep C screening- Yes HIV screening- Yes  Chronic abdominal pain Her pain has been worsening over the past several weeks.  She had an appointment with a new GI specialist which she unfortunately missed.  No recent change in her oral intake.  Appetite is poor overall.  She is not losing weight.  No bleeding, nighttime awakenings, or fevers.  She was questioning whether she had chronic pancreatitis or insufficiency.  She has never tried Creon before.  Past Medical History:  Diagnosis Date   Anxiety    Colon polyp    Gastroparesis    GERD (gastroesophageal reflux disease)    Hyperlipidemia    Lupus (HCC)    Migraine headache    Osteoarthritis    Premature atrial contractions    Raynaud's phenomenon    Symptomatic menopausal or female climacteric states      Past Surgical History:  Procedure Laterality Date   CHOLECYSTECTOMY     HEMORRHOID SURGERY     HIP PINNING     LAPAROSCOPY     NASAL SINUS SURGERY     TOTAL KNEE ARTHROPLASTY     left knee   WRIST SURGERY     Left wrist    Medications  Current Outpatient Medications on File Prior to Visit  Medication Sig Dispense Refill   aspirin 81 MG tablet Take 81 mg by mouth daily.     atenolol (TENORMIN) 50 MG tablet Take 1 tablet (50 mg total) by mouth daily. 90 tablet 2   atorvastatin (LIPITOR) 20 MG tablet Take 1 tablet (20 mg total) by mouth daily. 90 tablet 3   Blood Glucose Monitoring Suppl (ONE TOUCH ULTRA 2) w/Device KIT Use once daily to check blood sugar..   DX E11.9 1 kit 0    Buprenorphine HCl 450 MCG FILM Place 1 patch inside cheek 2 (two) times daily.     celecoxib (CELEBREX) 200 MG capsule Take 200 mg by mouth daily.     cholecalciferol (VITAMIN D3) 25 MCG (1000 UT) tablet Take 1,000 Units by mouth daily.     Cisapride POWD Take 5 mg by mouth 4 times daily prior to meals. 10 g 0   Continuous Blood Gluc Receiver ([place]) DEVI 1 each by Does not apply route See admin instructions. For continuous glucose monitoring; E13.9 1 each 0   Continuous Blood Gluc Sensor (DEXCOM G6 SENSOR) MISC 1 each by Does not apply route See admin instructions. For use with continuous glucose monitoring system. Change sensor every 10 days; E13.9 3 each 2   Continuous Blood Gluc Transmit (DEXCOM G6 TRANSMITTER) MISC 1 each by Does not apply route See admin instructions. For continuous glucose monitoring; E13.9 1 each 2   docusate sodium (COLACE) 100 MG capsule Take 100 mg by mouth 2 (two) times daily.     estradiol (CLIMARA) 0.05 mg/24hr patch Place 1 patch (0.05 mg total) onto the skin once a week. 4 patch 6   famotidine (PEPCID) 40 MG tablet Take 40 mg by mouth daily.  FLUoxetine (PROZAC) 40 MG capsule Take 40 mg by mouth daily.     Galcanezumab-gnlm 120 MG/ML SOAJ Inject into the skin.     glucose blood (ONETOUCH ULTRA) test strip Use once daily to check blood sugar five times daily.  DXE11.9 150 each 11   insulin aspart (NOVOLOG) 100 UNIT/ML injection 30 units a day via pump 30 mL 6   Insulin Infusion Pump (T:SLIM INSULIN DELIVERY SYSTEM) DEVI by Does not apply route.     Insulin Pen Needle (BD PEN NEEDLE NANO 2ND GEN) 32G X 4 MM MISC Use as need for insulin 5 times daily Ell.9 200 each 6   Insulin Pen Needle (NOVOFINE) 32G X 6 MM MISC Use with insulin pen 50 each 3   Lancets MISC Use daily to check blood sugar  4 times daily Dx E11.9 200 each 6   lidocaine (XYLOCAINE) 2 % solution Use as directed 10 mLs in the mouth or throat as needed for mouth pain. 200 mL 1   loratadine  (CLARITIN) 10 MG tablet Take 10 mg by mouth daily.     Melatonin 1 MG TABS Take by mouth.     Multiple Vitamin (MULTIVITAMIN WITH MINERALS) TABS tablet Take 1 tablet by mouth daily.     oxycodone (OXY-IR) 5 MG capsule Take 5 mg by mouth every 4 (four) hours as needed.     progesterone (PROMETRIUM) 100 MG capsule Take 1 capsule (100 mg total) by mouth as directed. 30 capsule 1   SUMAtriptan (IMITREX) 20 MG/ACT nasal spray Place 1 spray into the nose every 2 (two) hours as needed for migraine.     omeprazole (PRILOSEC) 20 MG capsule Take 1 capsule (20 mg total) by mouth 2 (two) times daily. (Patient taking differently: Take 40 mg by mouth 2 (two) times daily.) 180 capsule 3   Allergies Allergies  Allergen Reactions   Lyrica [Pregabalin]    Phenergan [Promethazine]    Prochlorperazine     Review of Systems: Constitutional:  no unexpected weight changes Eye:  no recent significant change in vision Ear/Nose/Mouth/Throat:  Ears:  no recent change in hearing Nose/Mouth/Throat:  no complaints of nasal congestion, no sore throat Cardiovascular: no chest pain Respiratory:  no shortness of breath Gastrointestinal:  no abdominal pain, no change in bowel habits GU:  Female: negative for dysuria Musculoskeletal/Extremities:  no new pain of the joints Integumentary (Skin/Breast):  no abnormal skin lesions reported Neurologic:  no headaches Endocrine:  denies fatigue  Exam BP 120/72 (BP Location: Right Arm, Patient Position: Sitting, Cuff Size: Small)    Pulse (!) 58    Temp 98.2 F (36.8 C) (Oral)    Resp 16    Ht _0  (1.575 m)    Wt 134 lb 9.6 oz (61.1 kg)    SpO2 96%    BMI 24.62 kg/m  General:  well developed, well nourished, in no apparent distress Skin:  no significant moles, warts, or growths Head:  no masses, lesions, or tenderness Eyes:  pupils equal and round, sclera anicteric without injection Ears:  canals without lesions, TMs shiny without retraction, no obvious effusion, no  erythema Nose:  nares patent, septum midline, mucosa normal, and no drainage or sinus tenderness Throat/Pharynx:  lips and gingiva without lesion; tongue and uvula midline; non-inflamed pharynx; no exudates or postnasal drainage Neck: neck supple without adenopathy, thyromegaly, or masses Lungs:  clear to auscultation, breath sounds equal bilaterally, no respiratory distress Cardio:  regular rate and rhythm, no LE edema Abdomen:  abdomen soft, mild TTP centrally; bowel sounds normal; no masses or organomegaly Genital: Defer to GYN Musculoskeletal:  symmetrical muscle groups noted without atrophy or deformity Extremities:  no clubbing, cyanosis, or edema, no deformities, no skin discoloration Neuro:  gait normal; deep tendon reflexes normal and symmetric Psych: well oriented with normal range of affect and appropriate judgment/insight  Assessment and Plan  Well adult exam - Plan: Lipid panel, Comprehensive metabolic panel  Chronic abdominal pain - Plan: lipase/protease/amylase (CREON) 36000 UNITS CPEP capsule   Well 66 y.o. female. Counseled on diet and exercise. Advanced directive copy requested. Bivalent COVID vaccination booster recommended.  Shingrix and Tdap today.  Chronic, unstable. Needs to follow up w GI. Will trial Creon. F/u w GI.  Other orders as above. Follow up in 1 yr or prn. The patient voiced understanding and agreement to the plan.  [place], DO 10/10/21 2:01 PM

## 2021-10-10 NOTE — Patient Instructions (Addendum)
Give [place] 2-3 business days to get the results of your labs back.   Keep the diet clean and stay active.  Please send me a copy of your Advanced Directive on MyChart.   Let us know if you need anything.

## 2021-10-11 ENCOUNTER — Other Ambulatory Visit: Payer: Self-pay

## 2021-10-11 DIAGNOSIS — E875 Hyperkalemia: Secondary | ICD-10-CM

## 2021-10-11 LAB — LIPID PANEL
Cholesterol: 169 mg/dL (ref 0–200)
HDL: 63 mg/dL (ref >39.00)
LDL Cholesterol: 83 mg/dL (ref 0–99)
NonHDL: 106.48
Total CHOL/HDL Ratio: 3
Triglycerides: 115 mg/dL (ref 0.0–149.0)
VLDL: 23 mg/dL (ref 0.0–40.0)

## 2021-10-11 LAB — COMPREHENSIVE METABOLIC PANEL
ALT: 27 U/L (ref 0–35)
AST: 27 U/L (ref 0–37)
Albumin: 4.2 g/dL (ref 3.5–5.2)
Alkaline Phosphatase: 71 U/L (ref 39–117)
BUN: 13 mg/dL (ref 6–23)
CO2: 39 mEq/L — ABNORMAL HIGH (ref 19–32)
Calcium: 9.3 mg/dL (ref 8.4–10.5)
Chloride: 99 mEq/L (ref 96–112)
Creatinine, Ser: 0.86 mg/dL (ref 0.40–1.20)
GFR: 71.4 mL/min (ref >60.00)
Glucose, Bld: 96 mg/dL (ref 70–99)
Potassium: 5.2 mEq/L — ABNORMAL HIGH (ref 3.5–5.1)
Sodium: 138 mEq/L (ref 135–145)
Total Bilirubin: 0.4 mg/dL (ref 0.2–1.2)
Total Protein: 6.8 g/dL (ref 6.0–8.3)

## 2021-10-12 ENCOUNTER — Other Ambulatory Visit: Payer: Self-pay | Admitting: Family Medicine

## 2021-10-12 DIAGNOSIS — E875 Hyperkalemia: Secondary | ICD-10-CM

## 2021-10-16 ENCOUNTER — Telehealth: Payer: Self-pay | Admitting: Family Medicine

## 2021-10-16 ENCOUNTER — Other Ambulatory Visit (INDEPENDENT_AMBULATORY_CARE_PROVIDER_SITE_OTHER): Payer: BC Managed Care – PPO

## 2021-10-16 DIAGNOSIS — E875 Hyperkalemia: Secondary | ICD-10-CM

## 2021-10-16 NOTE — Telephone Encounter (Signed)
Initiated PA on Covermymeds. GGE:[id number] [place] response.

## 2021-10-16 NOTE — Telephone Encounter (Signed)
Denise from [employer] wanted to make sure we received the Prior Auth for Creon. Fax number was verified and the PA will be faxed over to [place] again. For questions she can be reached at [phone].

## 2021-10-17 LAB — BASIC METABOLIC PANEL
BUN: 14 mg/dL (ref 6–23)
CO2: 31 mEq/L (ref 19–32)
Calcium: 9 mg/dL (ref 8.4–10.5)
Chloride: 98 mEq/L (ref 96–112)
Creatinine, Ser: 0.88 mg/dL (ref 0.40–1.20)
GFR: 69.45 mL/min (ref >60.00)
Glucose, Bld: 198 mg/dL — ABNORMAL HIGH (ref 70–99)
Potassium: 4.6 mEq/L (ref 3.5–5.1)
Sodium: 136 mEq/L (ref 135–145)

## 2021-10-18 DIAGNOSIS — N951 Menopausal and female climacteric states: Secondary | ICD-10-CM | POA: Diagnosis not present

## 2021-10-18 DIAGNOSIS — Z01419 Encounter for gynecological examination (general) (routine) without abnormal findings: Secondary | ICD-10-CM | POA: Diagnosis not present

## 2021-10-18 DIAGNOSIS — Z79890 Hormone replacement therapy (postmenopausal): Secondary | ICD-10-CM | POA: Diagnosis not present

## 2021-10-18 NOTE — Telephone Encounter (Signed)
Patient informed of PCP instructions. 

## 2021-10-18 NOTE — Telephone Encounter (Signed)
PA for this medication was denied.

## 2021-10-19 DIAGNOSIS — G2581 Restless legs syndrome: Secondary | ICD-10-CM | POA: Diagnosis not present

## 2021-10-19 DIAGNOSIS — M961 Postlaminectomy syndrome, not elsewhere classified: Secondary | ICD-10-CM | POA: Diagnosis not present

## 2021-10-19 DIAGNOSIS — M797 Fibromyalgia: Secondary | ICD-10-CM | POA: Diagnosis not present

## 2021-10-19 DIAGNOSIS — M5030 Other cervical disc degeneration, unspecified cervical region: Secondary | ICD-10-CM | POA: Diagnosis not present

## 2021-11-01 ENCOUNTER — Other Ambulatory Visit: Payer: Self-pay | Admitting: Family Medicine

## 2021-11-01 ENCOUNTER — Encounter: Payer: Self-pay | Admitting: Family Medicine

## 2021-11-01 DIAGNOSIS — N6489 Other specified disorders of breast: Secondary | ICD-10-CM

## 2021-11-16 DIAGNOSIS — M357 Hypermobility syndrome: Secondary | ICD-10-CM | POA: Diagnosis not present

## 2021-11-16 DIAGNOSIS — M5030 Other cervical disc degeneration, unspecified cervical region: Secondary | ICD-10-CM | POA: Diagnosis not present

## 2021-11-16 DIAGNOSIS — M5032 Other cervical disc degeneration, mid-cervical region, unspecified level: Secondary | ICD-10-CM | POA: Diagnosis not present

## 2021-11-16 DIAGNOSIS — M47812 Spondylosis without myelopathy or radiculopathy, cervical region: Secondary | ICD-10-CM | POA: Diagnosis not present

## 2021-11-16 DIAGNOSIS — M961 Postlaminectomy syndrome, not elsewhere classified: Secondary | ICD-10-CM | POA: Diagnosis not present

## 2021-11-16 DIAGNOSIS — M4312 Spondylolisthesis, cervical region: Secondary | ICD-10-CM | POA: Diagnosis not present

## 2021-11-21 DIAGNOSIS — E108 Type 1 diabetes mellitus with unspecified complications: Secondary | ICD-10-CM | POA: Diagnosis not present

## 2021-11-21 DIAGNOSIS — E139 Other specified diabetes mellitus without complications: Secondary | ICD-10-CM | POA: Diagnosis not present

## 2021-11-23 DIAGNOSIS — M545 Low back pain, unspecified: Secondary | ICD-10-CM | POA: Diagnosis not present

## 2021-11-23 DIAGNOSIS — G2581 Restless legs syndrome: Secondary | ICD-10-CM | POA: Diagnosis not present

## 2021-11-23 DIAGNOSIS — G8929 Other chronic pain: Secondary | ICD-10-CM | POA: Diagnosis not present

## 2021-11-23 DIAGNOSIS — G43109 Migraine with aura, not intractable, without status migrainosus: Secondary | ICD-10-CM | POA: Diagnosis not present

## 2021-12-05 ENCOUNTER — Ambulatory Visit: Payer: BC Managed Care – PPO | Admitting: Internal Medicine

## 2021-12-05 ENCOUNTER — Encounter: Payer: Self-pay | Admitting: Internal Medicine

## 2021-12-05 VITALS — BP 110/68 | HR 84 | Ht 62.0 in | Wt 137.2 lb

## 2021-12-05 DIAGNOSIS — G63 Polyneuropathy in diseases classified elsewhere: Secondary | ICD-10-CM

## 2021-12-05 DIAGNOSIS — R739 Hyperglycemia, unspecified: Secondary | ICD-10-CM

## 2021-12-05 DIAGNOSIS — E1340 Other specified diabetes mellitus with diabetic neuropathy, unspecified: Secondary | ICD-10-CM | POA: Diagnosis not present

## 2021-12-05 DIAGNOSIS — E139 Other specified diabetes mellitus without complications: Secondary | ICD-10-CM

## 2021-12-05 LAB — MICROALBUMIN / CREATININE URINE RATIO
Creatinine,U: 86.8 mg/dL
Microalb Creat Ratio: 0.8 mg/g (ref 0.0–30.0)
Microalb, Ur: <0.7 mg/dL (ref 0.0–1.9)

## 2021-12-05 LAB — POCT GLYCOSYLATED HEMOGLOBIN (HGB A1C): Hemoglobin A1C: 6.6 % — AB (ref 4.0–5.6)

## 2021-12-05 NOTE — Progress Notes (Signed)
?  ? ?Name: Taylor [name]  ?Age/ Sex: 66 y.o., female   ?MRN/ DOB: [id number], [birth year]    ? ?PCP: Shelda Pal, DO   ?Reason for Endocrinology Evaluation: Type 1 Diabetes Mellitus  ?Initial Endocrine Consultative Visit: 04/06/19  ? ? ?PATIENT IDENTIFIER: [name] is a 66 y.o. female with a past medical history of T1DM, SLE, GERD, Gastroparesis. The patient has followed with Endocrinology clinic since 04/06/2019 for consultative assistance with management of her diabetes. ? ? ? ? ? ?DIABETIC HISTORY:  ?Ms. Donalson was diagnosed with T1DM in 01/2019. Her hemoglobin A1c was 11.1% on diagnosis. No DKA. Islet cell Ab's positive, consistent with L ADA. She was initially was put on an insulin mix but due to fluctuating BG's she was changed to an MDI regimen.  She was started on an insulin pump January 2021 ? ? ? ?Thyroid Nodule History :  ?She was found to have an incidental MNG on CT scan. A dominant right thyroid nodule of 1.6x0.9x1 cm was performed with benign cytology in 05/2015 ?Due to concerns of weight loss and feeling poorly a cortisol was checked by her endocrinologist in 05/2018 at 9.8 mcg/dL  ? ? ?HORMONE REPLACEMENT THERAPY: ?She has been on Hormonal replacement therapy for > 5 yrs. She has been menopausal since ~ the age of 6 .  ?Follows with Gyn  ? ?SUBJECTIVE:  ? ?During the last visit (04/04/2021): A1c 6.4 %. No changes to insulin pump settings.  ? ? ? ?Today (03/22/2020): [name] is here for a follow up on diabetes management.  She checks her glucose multiple times a day through Dexcom. ? ? ? ?She had a follow-up with GYN 10/18/2021 ?She had a physical with her PCP 09/2021 ?She was started on Creon through her PCP for chronic abdominal pain 09/2021 ?She continues with neuropathic symptoms in the hands and feet  ?She is intolerant to Lyrica but tried Gabapentin but that made her drowsy but helped with her symptoms  ?She is having neck pain  ? ?Moving to [place] end of this year but  will be there between May - October  ? ? ? ?HOME DIABETES REGIMEN:  ?Novolog  through [facility] ? ? ? ? ?PUMP SETTINGS & DOWNLOAD ?  ?BASAL SETTINGS: ?Time  Basal Rate (units/ hr)   ?0000 0.150  ?0100 0.136  ?0600 0.125  ?0800 0.136  ?2200 0.150  ?Total 24-hour basal:  5.37 ? ? ? ? ?BOLUS SETTINGS: ?Insulin:Carb Ratio: 1:12 ?Sensitivity: 80 ?Target BG: 120 ?Active Insulin Time: 5 hours ? ?CURRENT PUMP STATISTICS:4/4-4/17/2023 ?Average BG: 165 ?Average Daily Carbs (g): 169 ?Average Daily Basal: 5(24 %) ?Average Daily Bolus: 16 (76%) ?Total daily insulin 21.69 ? ?  ?DIABETIC COMPLICATIONS: ?Microvascular complications:  ?  ?Denies: CKD, neuropathy, retinopathy  ?Last eye exam: Completed 07/13/2020 ?  ?Macrovascular complications:  ?  ?Denies: CAD, PVD, CVA ? ?HISTORY:  ?Past Medical History:  ?Past Medical History:  ?Diagnosis Date  ? Anxiety   ? Colon polyp   ? Gastroparesis   ? GERD (gastroesophageal reflux disease)   ? Hyperlipidemia   ? Lupus ([place])   ? Migraine headache   ? Osteoarthritis   ? Premature atrial contractions   ? Raynaud's phenomenon   ? Symptomatic menopausal or female climacteric states   ? ?Past Surgical History:  ?Past Surgical History:  ?Procedure Laterality Date  ? CHOLECYSTECTOMY    ? HEMORRHOID SURGERY    ? HIP PINNING    ? LAPAROSCOPY    ?  NASAL SINUS SURGERY    ? TOTAL KNEE ARTHROPLASTY    ? left knee  ? WRIST SURGERY    ? Left wrist  ? ?Social History:  reports that she has never smoked. She has never used smokeless tobacco. She reports that she does not drink alcohol and does not use drugs. ?Family History:  ?Family History  ?Problem Relation Age of Onset  ? CVA Mother   ? Alzheimer's disease Mother   ? Heart disease Father   ? Diabetes Father   ? Hypertension Father   ? Hyperlipidemia Father   ? CVA Brother   ? Cancer Maternal Aunt   ?     Ovarian Cancer  ? Cancer Maternal Uncle   ?     Colon Cancer  ? ? ? ?HOME MEDICATIONS: ?Allergies as of 12/05/2021   ? ?   Reactions  ? Lyrica [pregabalin]    ? Phenergan [promethazine]   ? Prochlorperazine   ? ?  ? ?  ?Medication List  ?  ? ?  ? Accurate as of December 05, 2021  9:53 AM. If you have any questions, ask your nurse or doctor.  ?  ?  ? ?  ? ?aspirin 81 MG tablet ?Take 81 mg by mouth daily. ?  ?atenolol 50 MG tablet ?Commonly known as: TENORMIN ?Take 1 tablet (50 mg total) by mouth daily. ?  ?atorvastatin 20 MG tablet ?Commonly known as: Lipitor ?Take 1 tablet (20 mg total) by mouth daily. ?  ?Buprenorphine HCl 450 MCG Film ?Place 1 patch inside cheek 2 (two) times daily. ?  ?celecoxib 200 MG capsule ?Commonly known as: CELEBREX ?Take 200 mg by mouth daily. ?  ?cholecalciferol 25 MCG (1000 UNIT) tablet ?Commonly known as: VITAMIN D3 ?Take 1,000 Units by mouth daily. ?  ?Cisapride Powd ?Take 5 mg by mouth 4 times daily prior to meals. ?  ?Dexcom G6 Receiver Devi ?1 each by Does not apply route See admin instructions. For continuous glucose monitoring; E13.9 ?  ?Dexcom G6 Sensor Misc ?1 each by Does not apply route See admin instructions. For use with continuous glucose monitoring system. Change sensor every 10 days; E13.9 ?  ?Dexcom G6 Transmitter Misc ?1 each by Does not apply route See admin instructions. For continuous glucose monitoring; E13.9 ?  ?docusate sodium 100 MG capsule ?Commonly known as: COLACE ?Take 100 mg by mouth 2 (two) times daily. ?  ?estradiol 0.05 mg/24hr patch ?Commonly known as: [place] ?Place 1 patch (0.05 mg total) onto the skin once a week. ?  ?famotidine 40 MG tablet ?Commonly known as: PEPCID ?Take 40 mg by mouth daily. ?  ?FLUoxetine 40 MG capsule ?Commonly known as: PROZAC ?Take 40 mg by mouth daily. ?  ?Galcanezumab-gnlm 120 MG/ML Soaj ?Inject into the skin. ?  ?insulin aspart 100 UNIT/ML injection ?Commonly known as: NovoLOG ?30 units a day via pump ?  ?Lancets Misc ?Use daily to check blood sugar  4 times daily Dx E11.9 ?  ?lidocaine 2 % solution ?Commonly known as: XYLOCAINE ?Use as directed 10 mLs in the mouth or throat as  needed for mouth pain. ?  ?lipase/protease/amylase 36000 UNITS Cpep capsule ?Commonly known as: Creon ?Take 2 capsules with meals and 1 capsule with a snack. ?  ?loratadine 10 MG tablet ?Commonly known as: CLARITIN ?Take 10 mg by mouth daily. ?  ?melatonin 1 MG Tabs tablet ?Take by mouth. ?  ?multivitamin with minerals Tabs tablet ?Take 1 tablet by mouth daily. ?  ?NovoFine  32G X 6 MM Misc ?Generic drug: Insulin Pen Needle ?Use with insulin pen ?  ?BD Pen Needle Nano 2nd Gen 32G X 4 MM Misc ?Generic drug: Insulin Pen Needle ?Use as need for insulin 5 times daily Ell.9 ?  ?ONE TOUCH ULTRA 2 w/Device Kit ?Use once daily to check blood sugar..   DX E11.9 ?  ?OneTouch Ultra test strip ?Generic drug: glucose blood ?Use once daily to check blood sugar five times daily.  DXE11.9 ?  ?oxycodone 5 MG capsule ?Commonly known as: OXY-IR ?Take 5 mg by mouth every 4 (four) hours as needed. ?  ?progesterone 100 MG capsule ?Commonly known as: PROMETRIUM ?Take 1 capsule (100 mg total) by mouth as directed. ?  ?SUMAtriptan 20 MG/ACT nasal spray ?Commonly known as: IMITREX ?Place 1 spray into the nose every 2 (two) hours as needed for migraine. ?  ?T:slim Insulin Delivery System [place] ?by Does not apply route. ?  ? ?  ? ? ? ?OBJECTIVE:  ? ?Vital Signs: BP 110/68 (BP Location: Left Arm, Patient Position: Sitting, Cuff Size: Small)   Pulse 84   Ht '5\' 2"'  (1.575 m)   Wt 137 lb 3.2 oz (62.2 kg)   SpO2 99%   BMI 25.09 kg/m?  ? ?Wt Readings from Last 3 Encounters:  ?10/10/21 134 lb 9.6 oz (61.1 kg)  ?08/04/21 133 lb (60.3 kg)  ?06/19/21 136 lb (61.7 kg)  ? ? ? ?Exam: ?General: Pt appears well and is in NAD  ?Lungs: Clear with good BS bilat with no rales, rhonchi, or wheezes  ?Heart: RRR with normal  ?Abdomen:  Soft, no guarding  ?Extremities: No pretibial edema.   ?Neuro: MS is good with appropriate affect, pt is alert and Ox3  ? ? ?DM foot exam: 12/05/2021 ? ?The skin of the feet is intact without sores or ulcerations. ?The pedal pulses  are 2+ on right and 2+ on left. ?The sensation is decreased to a screening 5.07, 10 gram monofilament bilaterally  ? ? ?DATA REVIEWED: ? ?Lab Results  ?Component Value Date  ? HGBA1C 6.7 (A) 08/04/2021  ? HGBA1

## 2021-12-05 NOTE — Patient Instructions (Signed)
?  Pump   T-Slim   Settings   ?Insulin type   Novolog    ?Basal rate      ? 0000 0.150  ? 0100 0.136  ? 0600 0.125  ? 0800 0.136  ? 1300 0.130  ? 2200 0.150  ?    ?I:C ratio      ? 0000-0000  1:11  ?    ?    ?AIT  5 hrs  ?    ?Sensitivity      ? 0000  70  ?    ? ?

## 2021-12-06 ENCOUNTER — Encounter: Payer: Self-pay | Admitting: Internal Medicine

## 2021-12-06 DIAGNOSIS — G63 Polyneuropathy in diseases classified elsewhere: Secondary | ICD-10-CM | POA: Insufficient documentation

## 2021-12-12 ENCOUNTER — Ambulatory Visit (INDEPENDENT_AMBULATORY_CARE_PROVIDER_SITE_OTHER): Payer: BC Managed Care – PPO

## 2021-12-12 DIAGNOSIS — Z23 Encounter for immunization: Secondary | ICD-10-CM

## 2021-12-12 NOTE — [place] Notes (Signed)
Taylor [name] is a 66 y.o. female presents to the office today for Shingles #2  injections, per physician's orders. ?Original order: Dr. Carmelia Roller   left Deltoid  was administered today. Patient tolerated injection. ? ? ? ?Lexxi Koslow M Dalessandro Baldyga ? ?

## 2021-12-19 DIAGNOSIS — M47812 Spondylosis without myelopathy or radiculopathy, cervical region: Secondary | ICD-10-CM | POA: Diagnosis not present

## 2021-12-25 ENCOUNTER — Other Ambulatory Visit: Payer: Self-pay | Admitting: Family Medicine

## 2021-12-25 DIAGNOSIS — R002 Palpitations: Secondary | ICD-10-CM

## 2022-01-02 DIAGNOSIS — R928 Other abnormal and inconclusive findings on diagnostic imaging of breast: Secondary | ICD-10-CM | POA: Diagnosis not present

## 2022-01-02 LAB — HM MAMMOGRAPHY: HM Mammogram: NORMAL (ref 0–4)

## 2022-01-03 ENCOUNTER — Encounter: Payer: Self-pay | Admitting: Family Medicine

## 2022-01-04 ENCOUNTER — Telehealth: Payer: Self-pay | Admitting: Family Medicine

## 2022-01-04 NOTE — Telephone Encounter (Signed)
[facility] called needing an order for a right breast ultrasound and right breast diagnostic mammogram to be in 12 months. It can be faxed to [phone]. Please advise.

## 2022-01-05 ENCOUNTER — Other Ambulatory Visit: Payer: Self-pay | Admitting: Family Medicine

## 2022-01-05 DIAGNOSIS — N6489 Other specified disorders of breast: Secondary | ICD-10-CM

## 2022-01-05 NOTE — Telephone Encounter (Signed)
Orders printed and faxed to location/number in message.

## 2022-02-15 DIAGNOSIS — Z79899 Other long term (current) drug therapy: Secondary | ICD-10-CM | POA: Diagnosis not present

## 2022-02-15 DIAGNOSIS — M797 Fibromyalgia: Secondary | ICD-10-CM | POA: Diagnosis not present

## 2022-02-15 DIAGNOSIS — M961 Postlaminectomy syndrome, not elsewhere classified: Secondary | ICD-10-CM | POA: Diagnosis not present

## 2022-02-15 DIAGNOSIS — M47812 Spondylosis without myelopathy or radiculopathy, cervical region: Secondary | ICD-10-CM | POA: Diagnosis not present

## 2022-03-01 DIAGNOSIS — Z794 Long term (current) use of insulin: Secondary | ICD-10-CM | POA: Diagnosis not present

## 2022-03-01 DIAGNOSIS — E109 Type 1 diabetes mellitus without complications: Secondary | ICD-10-CM | POA: Diagnosis not present

## 2022-04-16 ENCOUNTER — Other Ambulatory Visit: Payer: Self-pay | Admitting: Family Medicine

## 2022-04-16 DIAGNOSIS — E782 Mixed hyperlipidemia: Secondary | ICD-10-CM

## 2022-06-08 ENCOUNTER — Other Ambulatory Visit: Payer: Self-pay | Admitting: Family Medicine

## 2022-06-08 DIAGNOSIS — E782 Mixed hyperlipidemia: Secondary | ICD-10-CM

## 2022-06-18 ENCOUNTER — Encounter: Payer: Self-pay | Admitting: Family Medicine

## 2022-06-19 ENCOUNTER — Telehealth (INDEPENDENT_AMBULATORY_CARE_PROVIDER_SITE_OTHER): Payer: Medicare Other | Admitting: Family Medicine

## 2022-06-19 ENCOUNTER — Encounter: Payer: Self-pay | Admitting: Family Medicine

## 2022-06-19 DIAGNOSIS — U071 COVID-19: Secondary | ICD-10-CM | POA: Diagnosis not present

## 2022-06-19 MED ORDER — ONDANSETRON 4 MG PO TBDP: 4.0000 mg | ORAL_TABLET | Freq: Three times a day (TID) | ORAL | 0 refills | Status: DC | PRN

## 2022-06-19 MED ORDER — ATORVASTATIN CALCIUM 20 MG PO TABS: 20.0000 mg | ORAL_TABLET | Freq: Every day | ORAL | 3 refills | Status: AC

## 2022-06-19 MED ORDER — MOLNUPIRAVIR EUA 200MG CAPSULE: 4.0000 | ORAL_CAPSULE | Freq: Two times a day (BID) | ORAL | 0 refills | Status: AC

## 2022-06-19 NOTE — [place] Notes (Signed)
Chief Complaint  Patient presents with   Covid Positive    Tested positive 06/16/22   Sore Throat    Body aches Fatigue No appetite Unable to take a deep breath Cough     Taylor [name] here for URI complaints. Due to COVID-19 pandemic, we are interacting via web portal for an electronic face-to-face visit. I verified patient's ID using 2 identifiers. Patient agreed to proceed with visit via this method. Patient is at home, I am at office. Patient and I are present for visit.   Duration: 4 days  Associated symptoms: Fever (101 F), sinus congestion, rhinorrhea, sore throat, shortness of breath, myalgia, chest tightness, and coughing, nausea, fatigue, poor appetite Denies: ear pain, ear drainage, wheezing, and V/D, loss of taste/smell Treatment to date: Tylenol, naproxen, OTC cold meds Sick contacts: No Tested + for covid on 10/28  Past Medical History:  Diagnosis Date   Anxiety    Colon polyp    Gastroparesis    GERD (gastroesophageal reflux disease)    Hyperlipidemia    Lupus (HCC)    Migraine headache    Osteoarthritis    Premature atrial contractions    Raynaud's phenomenon    Symptomatic menopausal or female climacteric states     Objective No conversational dyspnea Age appropriate judgment and insight Nml affect and mood  COVID-19 - Plan: ondansetron (ZOFRAN-ODT) 4 MG disintegrating tablet, molnupiravir EUA (LAGEVRIO) 200 mg CAPS capsule  Continue to push fluids, practice good hand hygiene, cover mouth when coughing. Discussed CDC quarantining guidelines.  F/u prn. If starting to experience irreplaceable fluid loss, shaking, or shortness of breath, seek immediate care. Pt voiced understanding and agreement to the plan.  [place], DO 06/19/22 11:52 AM

## 2022-06-27 ENCOUNTER — Ambulatory Visit: Payer: BC Managed Care – PPO | Admitting: Internal Medicine

## 2022-07-06 ENCOUNTER — Encounter: Payer: Self-pay | Admitting: Family Medicine

## 2022-07-06 ENCOUNTER — Ambulatory Visit (INDEPENDENT_AMBULATORY_CARE_PROVIDER_SITE_OTHER): Payer: Medicare Other | Admitting: Family Medicine

## 2022-07-06 VITALS — BP 120/68 | HR 78 | Temp 98.4°F | Ht 62.0 in | Wt 138.4 lb

## 2022-07-06 DIAGNOSIS — K623 Rectal prolapse: Secondary | ICD-10-CM | POA: Diagnosis not present

## 2022-07-06 DIAGNOSIS — S76301A Unspecified injury of muscle, fascia and tendon of the posterior muscle group at thigh level, right thigh, initial encounter: Secondary | ICD-10-CM | POA: Diagnosis not present

## 2022-07-06 DIAGNOSIS — R198 Other specified symptoms and signs involving the digestive system and abdomen: Secondary | ICD-10-CM | POA: Diagnosis not present

## 2022-07-06 DIAGNOSIS — S76309A Unspecified injury of muscle, fascia and tendon of the posterior muscle group at thigh level, unspecified thigh, initial encounter: Secondary | ICD-10-CM

## 2022-07-06 MED ORDER — HYOSCYAMINE SULFATE 0.125 MG PO TABS: 0.1250 mg | ORAL_TABLET | ORAL | 2 refills | Status: AC | PRN

## 2022-07-06 NOTE — [place] Notes (Signed)
Chief Complaint  Patient presents with   chronic issues worsening    Subjective: Patient is a 66 y.o. female here for f/u.  Over the past 5 months, the patient has had increasing sensation to defecate without any stool present.  She is also experiencing tissue coming from her bottom that she needs to push back inside her after bowel movements.  She has a longstanding history of gastroparesis which did improve after starting insulin.  She is not having any bleeding, nausea/vomiting, or abdominal pain that is new.  Her husband had some Levsin for issues of his own.  She tried some of that which did help with the sensation.  She has also noticed a pain in her right hamstring region that pulls closer to her buttock.  No recent injury or change activity but she notices because she is sitting on the toilet longer, this has been worsening.  No back pain or groin pain.  She denies any bruising, redness, or swelling.  Past Medical History:  Diagnosis Date   Anxiety    Colon polyp    Gastroparesis    GERD (gastroesophageal reflux disease)    Hyperlipidemia    Lupus (HCC)    Migraine headache    Osteoarthritis    Premature atrial contractions    Raynaud's phenomenon    Symptomatic menopausal or female climacteric states     Objective: BP 120/68 (BP Location: Left Arm, Patient Position: Sitting, Cuff Size: Normal)   Pulse 78   Temp 98.4 F (36.9 C) (Oral)   Ht 5\' 2"  (1.575 m)   Wt 138 lb 6 oz (62.8 kg)   SpO2 95%   BMI 25.31 kg/m  General: Awake, appears stated age Heart: RRR, no LE edema Lungs: CTAB, no rales, wheezes or rhonchi. No accessory muscle use Abdomen: Bowel sounds present, soft, nondistended, mild discomfort in the lower quadrants bilaterally, no masses or organomegaly MSK: No TTP in the lumbar spine or midline, nothing over the SI joint, mild discomfort over the hamstring on the right, negative straight leg bilaterally Neuro: Gait is cautious and she uses a cane in her  right hand for ambulation; DTRs equal and symmetric in lower extremities without clonus Psych: Age appropriate judgment and insight, normal affect and mood  Assessment and Plan: Rectal prolapse - Plan: Ambulatory referral to General Surgery  Tenesmus (rectal) - Plan: hyoscyamine (LEVSIN) 0.125 MG tablet  Hamstring injury, initial encounter  Refer to gastroenterology Chronic, uncontrolled.  Will prescribe Levsin as this was helpful.  Would consider GI referral if rectal prolapse issue does not turn out to be the underlying etiology. Heat, ice, Tylenol, stretches and exercises.  Consider physical therapy if no improvement.  Could consider referral to sports medicine if diagnosis is in question. The patient voiced understanding and agreement to the plan.  [place], DO 07/06/22  11:08 AM

## 2022-07-06 NOTE — Patient Instructions (Addendum)
If you do not hear anything about your referral in the next 1-2 weeks, call our office and ask for an update.  Stay hydrated.  Heat (pad or rice pillow in microwave) over affected area, 10-15 minutes twice daily.   Ice/cold pack over area for 10-15 min twice daily.  OK to take Tylenol 1000 mg (2 extra strength tabs) or 975 mg (3 regular strength tabs) every 6 hours as needed.  Let us know if you need anything.  Semimembranosus Tendinitis Rehab  It is normal to feel mild stretching, pulling, tightness, or discomfort as you do these exercises, but you should stop right away if you feel sudden pain or your pain gets worse.  Stretching and range of motion exercises These exercises warm up your muscles and joints and improve the movement and flexibility of your thigh. These exercises also help to relieve pain, numbness, and tingling. Exercise A: Hamstring stretch, supine    Lie on your back. Loop a belt or towel across the ball of your left / right foot The ball of your foot is on the walking surface, right under your toes. Straighten your left / right knee and slowly pull on the belt to raise your leg. Stop when you feel a gentle stretch behind your left / right knee or thigh. Do not allow the knee to bend. Keep your other leg flat on the floor. Hold this position for 30 seconds. Repeat 2 times. Complete this exercise 3 times a week. Strengthening exercises These exercises build strength and endurance in your thigh. Endurance is the ability to use your muscles for a long time, even after they get tired. Exercise B: Straight leg raises (hip extensors) Lie on your belly on a bed or a firm surface with a pillow under your hips. Bend your left / right knee so your foot is straight up in the air. Squeeze your buttock muscles and lift your left / right thigh off the bed. Do not let your back arch. Hold this position for 3 seconds. Slowly return to the starting position. Let your muscles relax  completely before you do another repetition. Repeat 2 times. Complete this exercise 3 times a week. Exercise C: Bridge (hip extensors)     Lie on your back on a firm surface with your knees bent and your feet flat on the floor. Tighten your buttocks muscles and lift your bottom off the floor until your trunk is level with your thighs. You should feel the muscles working in your buttocks and the back of your thighs. If you do not feel these muscles, slide your feet 1-2 inches (2.5-5 cm) farther away from your buttocks. Do not arch your back. Hold this position for 3 seconds. Slowly lower your hips to the starting position. Let your buttocks muscles relax completely between repetitions. If this exercise is too easy, try doing it with your arms crossed over your chest. Repeat 2 times. Complete this exercise 3 times a week. Exercise D: Hamstring eccentric, prone Lie on your belly on a bed or on the floor. Start with your legs straight. Cross your legs at the ankles with your left / right leg on top. Using your bottom leg to do the work, bend both knees. Using just your left / right leg alone, slowly lower your leg back down toward the bed. Add a 5 lb weight as told by your health care provider. Let your muscles relax completely between repetitions. Repeat 2 times. Complete this exercise 3 times a  week. Exercise E: Squats Stand in front of a table, with your feet and knees pointing straight ahead. You may rest your hands on the table for balance but not for support. Slowly bend your knees and lower your hips like you are going to sit in a chair. Keep your thighs straight or pointed slightly outward. Keep your weight over your heels, not over your toes. Keep your lower legs upright so they are parallel with the table legs. Do not let your hips go lower than your knees. Stop when your knees are bent to the shape of an upside-down letter "L" (90 degree angle). Do not bend lower than told by your  health care provider. If your knee pain increases, do not bend as low. Hold the squat position 1-2 seconds. Slowly push with your legs to return to standing. Do not use your hands to pull yourself to standing. Repeat 2 times. Complete this exercise 3 times a week. Make sure you discuss any questions you have with your health care provider. Document Released: 08/06/2005 Document Revised: 04/12/2016 Document Reviewed: 05/10/2015 Elsevier Interactive Patient Education  [employer].

## 2022-07-25 ENCOUNTER — Encounter: Payer: Self-pay | Admitting: Internal Medicine

## 2022-07-25 ENCOUNTER — Ambulatory Visit (INDEPENDENT_AMBULATORY_CARE_PROVIDER_SITE_OTHER): Payer: Medicare Other | Admitting: Internal Medicine

## 2022-07-25 VITALS — BP 112/70 | HR 102 | Ht 62.0 in | Wt 140.8 lb

## 2022-07-25 DIAGNOSIS — G63 Polyneuropathy in diseases classified elsewhere: Secondary | ICD-10-CM

## 2022-07-25 DIAGNOSIS — E119 Type 2 diabetes mellitus without complications: Secondary | ICD-10-CM

## 2022-07-25 DIAGNOSIS — R0989 Other specified symptoms and signs involving the circulatory and respiratory systems: Secondary | ICD-10-CM

## 2022-07-25 DIAGNOSIS — E139 Other specified diabetes mellitus without complications: Secondary | ICD-10-CM | POA: Diagnosis not present

## 2022-07-25 LAB — POCT GLYCOSYLATED HEMOGLOBIN (HGB A1C): Hemoglobin A1C: 6.8 % — AB (ref 4.0–5.6)

## 2022-07-25 NOTE — Patient Instructions (Signed)

## 2022-07-25 NOTE — Progress Notes (Signed)
Name: Taylor [name]  Age/ Sex: 66 y.o., female   MRN/ DOB: [id number], Jan 25, 1956     PCP: Shelda Pal, DO   Reason for Endocrinology Evaluation: Type 1 Diabetes Mellitus  Initial Endocrine Consultative Visit: 04/06/19    PATIENT IDENTIFIER: Taylor [name] is a 66 y.o. female with a past medical history of T1DM, SLE, GERD, Gastroparesis. The patient has followed with Endocrinology clinic since 04/06/2019 for consultative assistance with management of her diabetes.      DIABETIC HISTORY:  Taylor [name] was diagnosed with T1DM in 01/2019. Her hemoglobin A1c was 11.1% on diagnosis. No DKA. Islet cell Ab's positive, consistent with L ADA. She was initially was put on an insulin mix but due to fluctuating BG's she was changed to an MDI regimen.  She was started on an insulin pump January 2021    Thyroid Nodule History :  She was found to have an incidental MNG on CT scan. A dominant right thyroid nodule of 1.6x0.9x1 cm was performed with benign cytology in 05/2015 Due to concerns of weight loss and feeling poorly a cortisol was checked by her endocrinologist in 05/2018 at 9.8 mcg/dL    HORMONE REPLACEMENT THERAPY: She has been on Hormonal replacement therapy for > 5 yrs. She has been menopausal since ~ the age of 79 .  Follows with Gyn   SUBJECTIVE:   During the last visit (04/04/2021): A1c 6.4 %. No changes to insulin pump settings.     Today (03/22/2020): Taylor [name] is here for a follow up on diabetes management.  She checks her glucose multiple times a day through Dexcom.  She had a f/u with her PCP 07/06/2022 for rectal prolapse She was seen by [facility] neurology for migraine headaches 06/28/2022 She continues to follow-up with the pain clinic She continues with neuropathic symptoms in the hands and feet   Moving to [place] next year  She become a grand mother for the first time.    HOME DIABETES REGIMEN:  Novolog  through [facility]     PUMP SETTINGS &  DOWNLOAD   BASAL SETTINGS: Time  Basal Rate (units/ hr)   0000 0.150  0100 0.136  0600 0.125  0800 0.136  2200 0.150       BOLUS SETTINGS: Insulin:Carb Ratio: 1:11 Sensitivity: 70 Target BG: 110 Active Insulin Time: 5 hours  CURRENT PUMP STATISTICS:4/4-4/17/2023 Average BG: 161 Average Daily Carbs (g): 118 Average Daily Basal: 5.27(27 %) Average Daily Bolus: 14.53 (73%) Total daily insulin 19.8      CONTINUOUS GLUCOSE MONITORING RECORD INTERPRETATION    Dates of Recording: 11/23 - 07/25/2022  Sensor description: Dexcom  Results statistics:   CGM use % of time 89  Average and SD 161/57  Time in range      69%  % Time Above 180 23  % Time above 250 6  % Time Below target 1   Glycemic patterns summary: BG's are optimal during the night and between the meals, hyperglycemia noted after meals  Hyperglycemic episodes postprandial  Hypoglycemic episodes occurred after correction  Overnight periods: Optimal  DIABETIC COMPLICATIONS: Microvascular complications:    Denies: CKD, neuropathy, retinopathy  Last eye exam: Completed 07/13/2020   Macrovascular complications:    Denies: CAD, PVD, CVA  HISTORY:  Past Medical History:  Past Medical History:  Diagnosis Date   Anxiety    Colon polyp    Gastroparesis    GERD (gastroesophageal reflux disease)    Hyperlipidemia  Lupus ([place])    Migraine headache    Osteoarthritis    Premature atrial contractions    Raynaud's phenomenon    Symptomatic menopausal or female climacteric states    Past Surgical History:  Past Surgical History:  Procedure Laterality Date   CHOLECYSTECTOMY     HEMORRHOID SURGERY     HIP PINNING     LAPAROSCOPY     NASAL SINUS SURGERY     TOTAL KNEE ARTHROPLASTY     left knee   WRIST SURGERY     Left wrist   Social History:  reports that she has never smoked. She has never used smokeless tobacco. She reports that she does not drink alcohol and does not use drugs. Family  History:  Family History  Problem Relation Age of Onset   CVA Mother    Alzheimer's disease Mother    Heart disease Father    Diabetes Father    Hypertension Father    Hyperlipidemia Father    CVA Brother    Cancer Maternal Aunt        Ovarian Cancer   Cancer Maternal Uncle        Colon Cancer     HOME MEDICATIONS: Allergies as of 07/25/2022       Reactions   Lyrica [pregabalin]    Phenergan [promethazine]    Prochlorperazine         Medication List        Accurate as of July 25, 2022  3:53 PM. If you have any questions, ask your nurse or doctor.          aspirin 81 MG tablet Take 81 mg by mouth daily.   atenolol 50 MG tablet Commonly known as: TENORMIN TAKE 1 TABLET(50 MG) BY MOUTH DAILY   atorvastatin 20 MG tablet Commonly known as: Lipitor Take 1 tablet (20 mg total) by mouth daily.   Buprenorphine HCl 450 MCG Film Place 1 patch inside cheek 2 (two) times daily.   Belbuca 450 MCG Film Generic drug: Buprenorphine HCl Place inside cheek.   celecoxib 200 MG capsule Commonly known as: CELEBREX Take 200 mg by mouth daily.   cholecalciferol 25 MCG (1000 UNIT) tablet Commonly known as: VITAMIN D3 Take 1,000 Units by mouth daily.   Cisapride Powd Take 5 mg by mouth 4 times daily prior to meals.   Dexcom G6 Receiver Devi 1 each by Does not apply route See admin instructions. For continuous glucose monitoring; E13.9   Dexcom G6 Sensor Misc 1 each by Does not apply route See admin instructions. For use with continuous glucose monitoring system. Change sensor every 10 days; E13.9   Dexcom G6 Transmitter Misc 1 each by Does not apply route See admin instructions. For continuous glucose monitoring; E13.9   docusate sodium 100 MG capsule Commonly known as: COLACE Take 100 mg by mouth 2 (two) times daily.   estradiol 0.05 mg/24hr patch Commonly known as: Climara Place 1 patch (0.05 mg total) onto the skin once a week.   estradiol 0.05 MG/24HR  patch Commonly known as: VIVELLE-DOT 1 patch once a week.   famotidine 40 MG tablet Commonly known as: PEPCID Take 40 mg by mouth daily.   FLUoxetine 40 MG capsule Commonly known as: PROZAC Take 40 mg by mouth daily.   Galcanezumab-gnlm 120 MG/ML Soaj Inject into the skin.   hyoscyamine 0.125 MG tablet Commonly known as: LEVSIN Take 1 tablet (0.125 mg total) by mouth every 4 (four) hours as needed.   insulin  aspart 100 UNIT/ML injection Commonly known as: NovoLOG 30 units a day via pump   Lancets Misc Use daily to check blood sugar  4 times daily Dx E11.9   lidocaine 2 % solution Commonly known as: XYLOCAINE Use as directed 10 mLs in the mouth or throat as needed for mouth pain.   lipase/protease/amylase 36000 UNITS Cpep capsule Commonly known as: Creon Take 2 capsules with meals and 1 capsule with a snack.   loratadine 10 MG tablet Commonly known as: CLARITIN Take 10 mg by mouth daily.   melatonin 1 MG Tabs tablet Take by mouth.   multivitamin with minerals Tabs tablet Take 1 tablet by mouth daily.   NovoFine 32G X 6 MM Misc Generic drug: Insulin Pen Needle Use with insulin pen   BD Pen Needle Nano 2nd Gen 32G X 4 MM Misc Generic drug: Insulin Pen Needle Use as need for insulin 5 times daily Ell.9   ondansetron 4 MG disintegrating tablet Commonly known as: ZOFRAN-ODT Take 1 tablet (4 mg total) by mouth every 8 (eight) hours as needed for nausea or vomiting.   ONE TOUCH ULTRA 2 w/Device Kit Use once daily to check blood sugar..   DX E11.9   OneTouch Ultra test strip Generic drug: glucose blood Use once daily to check blood sugar five times daily.  DXE11.9   oxycodone 5 MG capsule Commonly known as: OXY-IR Take 5 mg by mouth every 4 (four) hours as needed.   progesterone 100 MG capsule Commonly known as: PROMETRIUM Take 1 capsule (100 mg total) by mouth as directed.   SUMAtriptan 20 MG/ACT nasal spray Commonly known as: IMITREX Place 1 spray  into the nose every 2 (two) hours as needed for migraine.   T:slim Insulin Delivery System Devi by Does not apply route.         OBJECTIVE:   Vital Signs: BP 112/70 (BP Location: Left Arm, Patient Position: Sitting, Cuff Size: Small)   Pulse (!) 102   Ht _0  (1.575 m)   Wt 140 lb 12.8 oz (63.9 kg)   SpO2 99%   BMI 25.75 kg/m   Wt Readings from Last 3 Encounters:  07/25/22 140 lb 12.8 oz (63.9 kg)  07/06/22 138 lb 6 oz (62.8 kg)  12/05/21 137 lb 3.2 oz (62.2 kg)     Exam: General: Pt appears well and is in NAD  Lungs: Clear with good BS bilat with no rales, rhonchi, or wheezes  Heart: RRR with normal  Abdomen:  Soft, no guarding  Extremities: No pretibial edema.  Patient has been noted with purplish discoloration of the left foot and I was unable to palpate dorsalis pedis pulse  Neuro: MS is good with appropriate affect, pt is alert and Ox3    DM foot exam: 12/05/2021  The skin of the feet is intact without sores or ulcerations. The sensation is decreased to a screening 5.07, 10 gram monofilament bilaterally    DATA REVIEWED:  Lab Results  Component Value Date   HGBA1C 6.8 (A) 07/25/2022   HGBA1C 6.6 (A) 12/05/2021   HGBA1C 6.7 (A) 08/04/2021    Latest Reference Range & Units 08/04/21 09:20  Sodium 135 - 145 mEq/L 137  Potassium 3.5 - 5.1 mEq/L 5.0  Chloride 96 - 112 mEq/L 98  CO2 19 - 32 mEq/L 34 (H)  Glucose 70 - 99 mg/dL 220 (H)  BUN 6 - 23 mg/dL 9  Creatinine 0.40 - 1.20 mg/dL 0.78  Calcium 8.4 - 10.5 mg/dL  9.6  Lipase 11.0 - 59.0 U/L 6.0 (L)  GFR >60.00 mL/min 80.38    Results for FABIENNE, NOLASCO (MRN [id number]) as of 05/22/2019 14:05  Ref. Range 04/06/2019 14:43 04/06/2019 15:02  Glutamic Acid Decarb Ab Latest Ref Range: <5 IU/mL >250 (H)   Islet Cell Ab Titer Latest Units: JDF units  1,280 (H)    ASSESSMENT / PLAN / RECOMMENDATIONS:    1) Latent Autoimmune Diabetes Mellitus, Optimally Controlled with neuropathic complications- Most recent A1c  of 6.8 %. Goal A1c < 7.0%   -Her A1c remains optimal -In reviewing pump/CGM download the patient has been noted with hyperglycemia postprandial, this is severe at suppertime, the patient has been noted that she does not bolus for any carbohydrates at dinnertime just for breakfast and lunch.  I have strongly encouraged the patient to continue to enter her carbohydrates at suppertime as that is why her BG's have been the highest -I am also going to adjust her sensitivity factor, as she seems to have hypoglycemic episodes with corrections -I will change insulin to carb ratio at lunchtime as below   MEDICATIONS: Novolog   T-Slim Pump setting   Pump   T-Slim   Settings   Insulin type   Novolog    Basal rate       0000 0.150   0100 0.136   0600 0.125   0800 0.136   1300 0.130   2200 0.150      I:C ratio       0000 1:11   0100 1:11   0600 1:11   0800 1:11   1300 1:10   2200 1:11      AIT  5 hrs      Sensitivity       0000  75         EDUCATION / INSTRUCTIONS: BG monitoring instructions: Patient is instructed to check her blood sugars 4 times a day Call [place] [facility] if: BG persistently < 70  I reviewed the Rule of 15 for the treatment of hypoglycemia in detail with the patient. Literature supplied.     2) Peripheral Neuropathy:  -She is allergic to Lyrica, gabapentin makes her drowsy   3. Decrease Dorsalis Pedis pulses :  -We will proceed with ABI   F/U in 6 months    Signed electronically by: Mack Guise, MD  [facility] Endocrinology  [facility] [address] [place], [place] [address] Phone: [phone] FAX: [phone]   CC: Shelda Pal, [address] [place]  [address] Phone: [phone]  Fax: [phone]  Return to Endocrinology clinic as below: Future Appointments  Date Time Provider [place]  10/12/2022  1:30 PM Shelda Pal, DO  [facility]  12/07/2022 12:10 PM Madeleine Fenn, Melanie Crazier, MD [facility] None

## 2022-07-26 ENCOUNTER — Encounter: Payer: Self-pay | Admitting: Internal Medicine

## 2022-08-07 ENCOUNTER — Ambulatory Visit (HOSPITAL_COMMUNITY)
Admission: RE | Admit: 2022-08-07 | Discharge: 2022-08-07 | Disposition: A | Discharge status: Home / Self Care | Payer: Medicare Other | Source: Ambulatory Visit | Attending: Internal Medicine | Admitting: Internal Medicine

## 2022-08-07 DIAGNOSIS — R0989 Other specified symptoms and signs involving the circulatory and respiratory systems: Secondary | ICD-10-CM | POA: Insufficient documentation

## 2022-09-25 ENCOUNTER — Other Ambulatory Visit: Payer: Self-pay

## 2022-09-25 MED ORDER — INSULIN ASPART 100 UNIT/ML IJ SOLN: INTRAMUSCULAR | 6 refills | Status: DC

## 2022-10-10 ENCOUNTER — Other Ambulatory Visit: Payer: Self-pay | Admitting: Family Medicine

## 2022-10-10 DIAGNOSIS — R002 Palpitations: Secondary | ICD-10-CM

## 2022-10-10 MED ORDER — ATENOLOL 50 MG PO TABS: ORAL_TABLET | ORAL | 2 refills | Status: AC

## 2022-10-12 ENCOUNTER — Ambulatory Visit (INDEPENDENT_AMBULATORY_CARE_PROVIDER_SITE_OTHER): Payer: Medicare Other | Admitting: Family Medicine

## 2022-10-12 ENCOUNTER — Encounter: Payer: Self-pay | Admitting: Family Medicine

## 2022-10-12 VITALS — BP 108/70 | HR 74 | Temp 98.2°F | Ht 62.0 in | Wt 145.2 lb

## 2022-10-12 DIAGNOSIS — I493 Ventricular premature depolarization: Secondary | ICD-10-CM

## 2022-10-12 DIAGNOSIS — R42 Dizziness and giddiness: Secondary | ICD-10-CM

## 2022-10-12 DIAGNOSIS — E785 Hyperlipidemia, unspecified: Secondary | ICD-10-CM | POA: Diagnosis not present

## 2022-10-12 DIAGNOSIS — K623 Rectal prolapse: Secondary | ICD-10-CM | POA: Diagnosis not present

## 2022-10-12 DIAGNOSIS — R490 Dysphonia: Secondary | ICD-10-CM

## 2022-10-12 DIAGNOSIS — E2839 Other primary ovarian failure: Secondary | ICD-10-CM

## 2022-10-12 NOTE — Patient Instructions (Addendum)
Good luck in [place]!  If you do not hear anything about your referral in the next 1-2 weeks, call our office and ask for an update.  Give [place] 2-3 business days to get the results of your labs back.   Keep the diet clean and stay active.  General surgery team: [phone]   Let us know if you need anything.

## 2022-10-12 NOTE — Progress Notes (Signed)
Chief Complaint  Patient presents with   Annual Exam    A little dizziness today Throat problems, things get stuck Hoarse sounding when she talks Moving to [place] in May    Subjective: Dyslipidemia Patient presents for dyslipidemia follow up. Currently taking Lipitor 20 mg/d and compliance with treatment thus far has been good. She denies myalgias. She is adhering to a healthy diet. Exercise: cycling The patient is not known to have coexisting coronary artery disease.  PVC's Taking atenolol 50 mg/d.  When she stopped taking the medication, her symptoms come back.  No shortness of breath, lightheadedness, dizziness associated with her palpitations.  Throat issue The patient swallows, she feels the urge to cough.  She has a history of esophageal stricture which was dilated several years ago.  The coughing sensation takes place in her neck area.  It is usually with solids.  When she drinks fluids she does not have the issue.  She is also able to swallow her pills/medication without issues.  There is no pain.  She has not lost weight unintentionally.  She does not take care to chew her food adequately.  The patient has associated hoarseness.  She has not seen an ENT in many years.  Rectal prolapse Patient continues to deal with rectal prolapse symptoms.  The general surgery team never reached out to her.  She would like a referral to a clinic in [place], [place].  Lightheadedness The patient has a history of allergies.  She has fullness in both of the ears.  She feels a little bit lightheaded and wonders if she has an inner ear issue.  No spinning, falls, decreased oral intake, bleeding, nausea/vomiting, diarrhea.  Past Medical History:  Diagnosis Date   Anxiety    Colon polyp    Gastroparesis    GERD (gastroesophageal reflux disease)    Hyperlipidemia    Lupus ([facility])    Migraine headache    Osteoarthritis    Premature atrial contractions    Raynaud's phenomenon     Symptomatic menopausal or female climacteric states     Objective: BP 108/70 (BP Location: Left Arm, Patient Position: Sitting, Cuff Size: Normal)   Pulse 74   Temp 98.2 F (36.8 C) (Oral)   Ht '5\' 2"'$  (1.575 m)   Wt 145 lb 4 oz (65.9 kg)   SpO2 96%   BMI 26.57 kg/m  General: Awake, appears stated age HEENT: MMM, 80% obstructed with wax bilaterally, TMs negative bilaterally, no otorrhea, nares are patent without rhinorrhea, no sinus TTP Heart: RRR, no LE edema, no bruits Lungs: CTAB, no rales, wheezes or rhonchi. No accessory muscle use Psych: Age appropriate judgment and insight, normal affect and mood  Assessment and Plan: Dyslipidemia - Plan: Lipid panel, Comprehensive metabolic panel  Frequent PVCs  Hoarseness of voice - Plan: Ambulatory referral to ENT  Rectal prolapse - Plan: Ambulatory referral to General Surgery  Light-headed - Plan: CBC  Estrogen deficiency - Plan: DG Bone Density  Chronic, stable.  Check labs.  Continue Lipitor 20 mg daily.  Counseled on diet and exercise. Chronic, stable.  Continue atenolol 50 mg daily. Refer to ENT for further evaluation.  If that exam is unremarkable, we can consider SLP evaluation or referral to GI given her history of the esophageal stricture.  She reports similar symptoms in the past that were relieved with dilatation. Referral to general surgery team in [place], [place] placed.  I did provide her with our local general surgery team's contact information if she  wishes to reach out.  She is moving to [place] in May so this may be difficult. Probably related to inner ear issues.  She will go back to taking Flonase daily.  Check CBC today.  Stay hydrated. We can get her bone density scan before she leaves. F/u as needed with me at this point. The patient voiced understanding and agreement to the plan.  I spent 45 minutes with the patient discussing the above plans in addition to reviewing her chart on the same day of the  visit.  [place], DO 10/12/22  4:55 PM

## 2022-10-13 LAB — LIPID PANEL
Cholesterol: 210 mg/dL — ABNORMAL HIGH (ref <200)
HDL: 77 mg/dL (ref >=50)
LDL Cholesterol (Calc): 112 mg/dL (calc) — ABNORMAL HIGH
Non-HDL Cholesterol (Calc): 133 mg/dL (calc) — ABNORMAL HIGH (ref <130)
Total CHOL/HDL Ratio: 2.7 (calc) (ref <5.0)
Triglycerides: 105 mg/dL (ref <150)

## 2022-10-13 LAB — COMPREHENSIVE METABOLIC PANEL
AG Ratio: 1.3 (calc) (ref 1.0–2.5)
ALT: 14 U/L (ref 6–29)
AST: 24 U/L (ref 10–35)
Albumin: 4.1 g/dL (ref 3.6–5.1)
Alkaline phosphatase (APISO): 73 U/L (ref 37–153)
BUN: 18 mg/dL (ref 7–25)
CO2: 34 mmol/L — ABNORMAL HIGH (ref 20–32)
Calcium: 9.2 mg/dL (ref 8.6–10.4)
Chloride: 98 mmol/L (ref 98–110)
Creat: 0.81 mg/dL (ref 0.50–1.05)
Globulin: 3.1 g/dL (calc) (ref 1.9–3.7)
Glucose, Bld: 118 mg/dL — ABNORMAL HIGH (ref 65–99)
Potassium: 4.8 mmol/L (ref 3.5–5.3)
Sodium: 138 mmol/L (ref 135–146)
Total Bilirubin: 0.5 mg/dL (ref 0.2–1.2)
Total Protein: 7.2 g/dL (ref 6.1–8.1)

## 2022-10-13 LAB — CBC
HCT: 34.6 % — ABNORMAL LOW (ref 35.0–45.0)
Hemoglobin: 11.3 g/dL — ABNORMAL LOW (ref 11.7–15.5)
MCH: 28.3 pg (ref 27.0–33.0)
MCHC: 32.7 g/dL (ref 32.0–36.0)
MCV: 86.5 fL (ref 80.0–100.0)
MPV: 10.3 fL (ref 7.5–12.5)
Platelets: 200 10*3/uL (ref 140–400)
RBC: 4 10*6/uL (ref 3.80–5.10)
RDW: 12.8 % (ref 11.0–15.0)
WBC: 5.5 10*3/uL (ref 3.8–10.8)

## 2022-10-15 ENCOUNTER — Other Ambulatory Visit: Payer: Self-pay | Admitting: Family Medicine

## 2022-10-15 DIAGNOSIS — R799 Abnormal finding of blood chemistry, unspecified: Secondary | ICD-10-CM

## 2022-10-15 NOTE — Addendum Note (Signed)
Addended by: Sharon Seller B on: 10/15/2022 03:46 PM   Modules accepted: Orders

## 2022-10-18 NOTE — Addendum Note (Signed)
Addended by: Kelle Darting A on: 10/18/2022 03:04 PM   Modules accepted: Orders

## 2022-10-23 ENCOUNTER — Other Ambulatory Visit (INDEPENDENT_AMBULATORY_CARE_PROVIDER_SITE_OTHER): Payer: Medicare Other

## 2022-10-23 ENCOUNTER — Other Ambulatory Visit: Payer: Self-pay | Admitting: *Deleted

## 2022-10-23 DIAGNOSIS — R799 Abnormal finding of blood chemistry, unspecified: Secondary | ICD-10-CM

## 2022-10-23 LAB — CBC WITH DIFFERENTIAL/PLATELET
Absolute Monocytes: 478 cells/uL (ref 200–950)
Basophils Absolute: 28 cells/uL (ref 0–200)
Basophils Relative: 0.6 %
Eosinophils Absolute: 419 cells/uL (ref 15–500)
Eosinophils Relative: 9.1 %
HCT: 36.2 % (ref 35.0–45.0)
Hemoglobin: 11.6 g/dL — ABNORMAL LOW (ref 11.7–15.5)
Lymphs Abs: 1375 cells/uL (ref 850–3900)
MCH: 28.3 pg (ref 27.0–33.0)
MCHC: 32 g/dL (ref 32.0–36.0)
MCV: 88.3 fL (ref 80.0–100.0)
MPV: 11.1 fL (ref 7.5–12.5)
Monocytes Relative: 10.4 %
Neutro Abs: 2300 cells/uL (ref 1500–7800)
Neutrophils Relative %: 50 %
Platelets: 197 10*3/uL (ref 140–400)
RBC: 4.1 10*6/uL (ref 3.80–5.10)
RDW: 12.9 % (ref 11.0–15.0)
Total Lymphocyte: 29.9 %
WBC: 4.6 10*3/uL (ref 3.8–10.8)

## 2022-10-24 ENCOUNTER — Other Ambulatory Visit: Payer: Self-pay | Admitting: Family Medicine

## 2022-10-24 DIAGNOSIS — R799 Abnormal finding of blood chemistry, unspecified: Secondary | ICD-10-CM

## 2022-10-24 LAB — IRON,TIBC AND FERRITIN PANEL
%SAT: 19 % (calc) (ref 16–45)
Ferritin: 10 ng/mL — ABNORMAL LOW (ref 16–288)
Iron: 70 ug/dL (ref 45–160)
TIBC: 374 mcg/dL (calc) (ref 250–450)

## 2022-10-24 LAB — PATHOLOGIST SMEAR REVIEW

## 2022-10-24 NOTE — Addendum Note (Signed)
Addended by: Sharon Seller B on: 10/24/2022 05:03 PM   Modules accepted: Orders

## 2022-10-29 ENCOUNTER — Other Ambulatory Visit: Payer: Medicare Other

## 2022-11-01 LAB — HM DIABETES EYE EXAM

## 2022-11-02 ENCOUNTER — Encounter: Payer: Self-pay | Admitting: Endocrinology

## 2022-11-03 IMAGING — MG MM DIGITAL DIAGNOSTIC UNILAT*R* W/ TOMO W/ CAD
6 series · 6 of 18 positions shown · non-contrast
Comparison: Previous exam(s).

CLINICAL DATA: 65-year-old female presenting for first six-month
follow-up of a probably benign right breast asymmetry.

EXAM:
DIGITAL DIAGNOSTIC UNILATERAL RIGHT MAMMOGRAM WITH TOMOSYNTHESIS AND
CAD
TECHNIQUE: Right digital diagnostic mammography and breast tomosynthesis was
performed. The images were evaluated with computer-aided detection.

[R CC synth-2D]
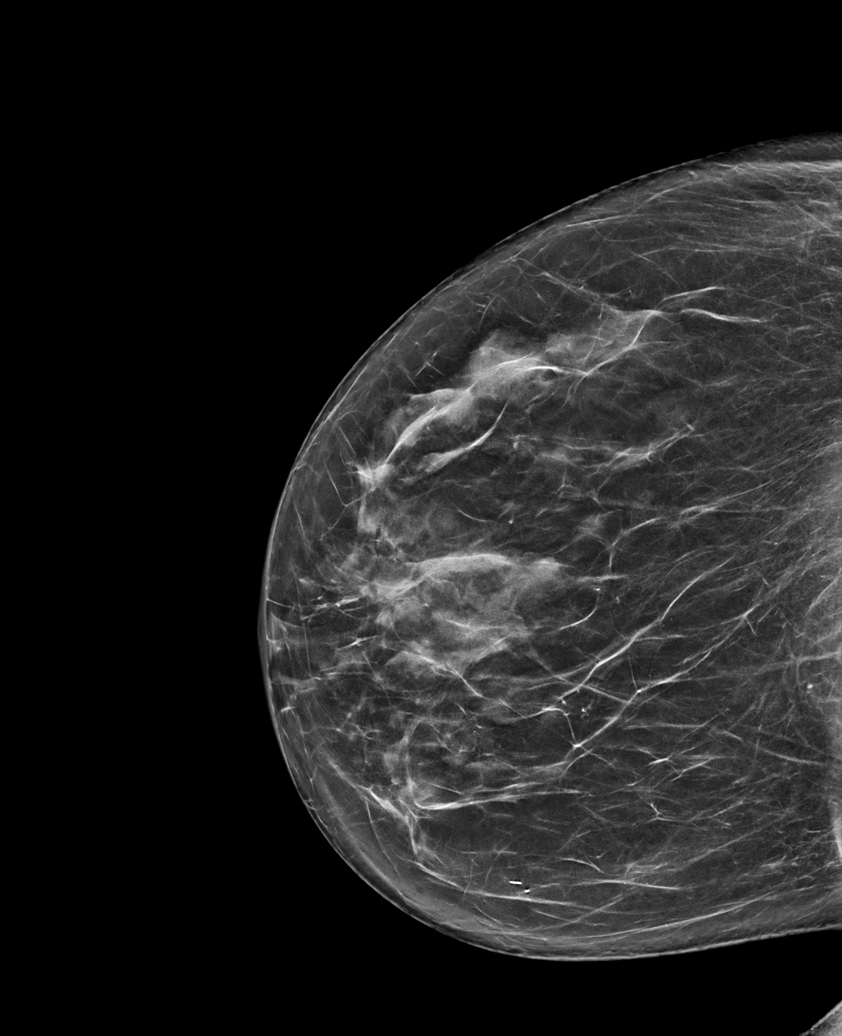

[R MLO synth-2D (1 of 2)]
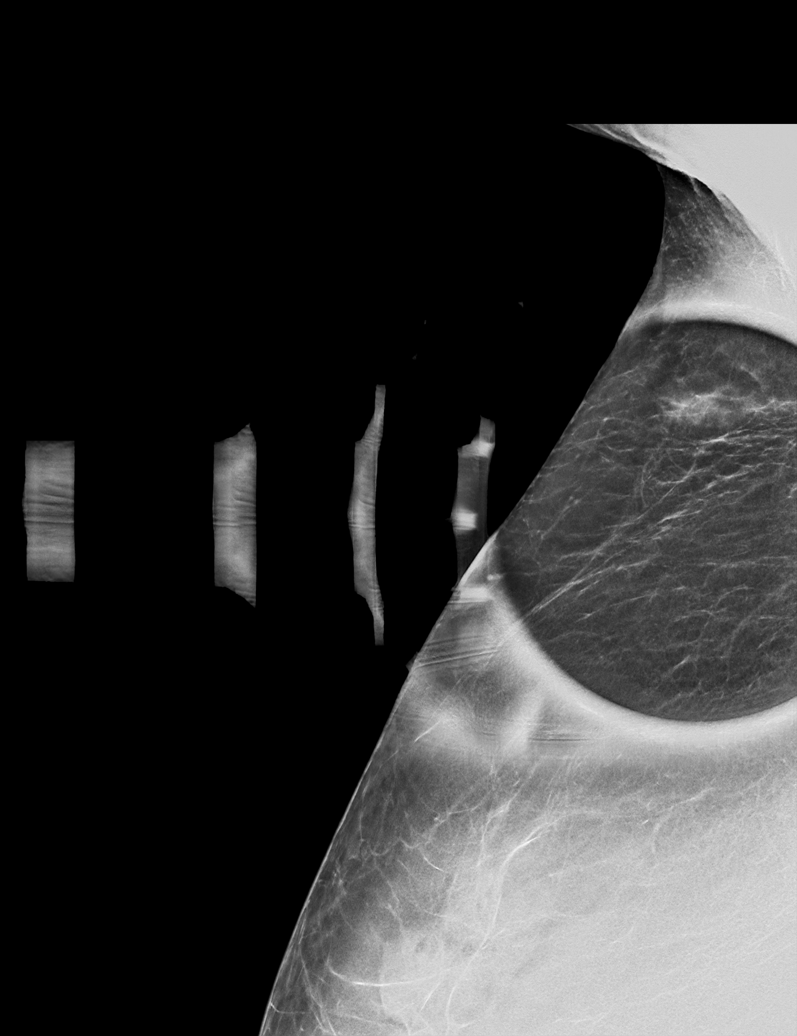

[R MLO synth-2D (2 of 2)]
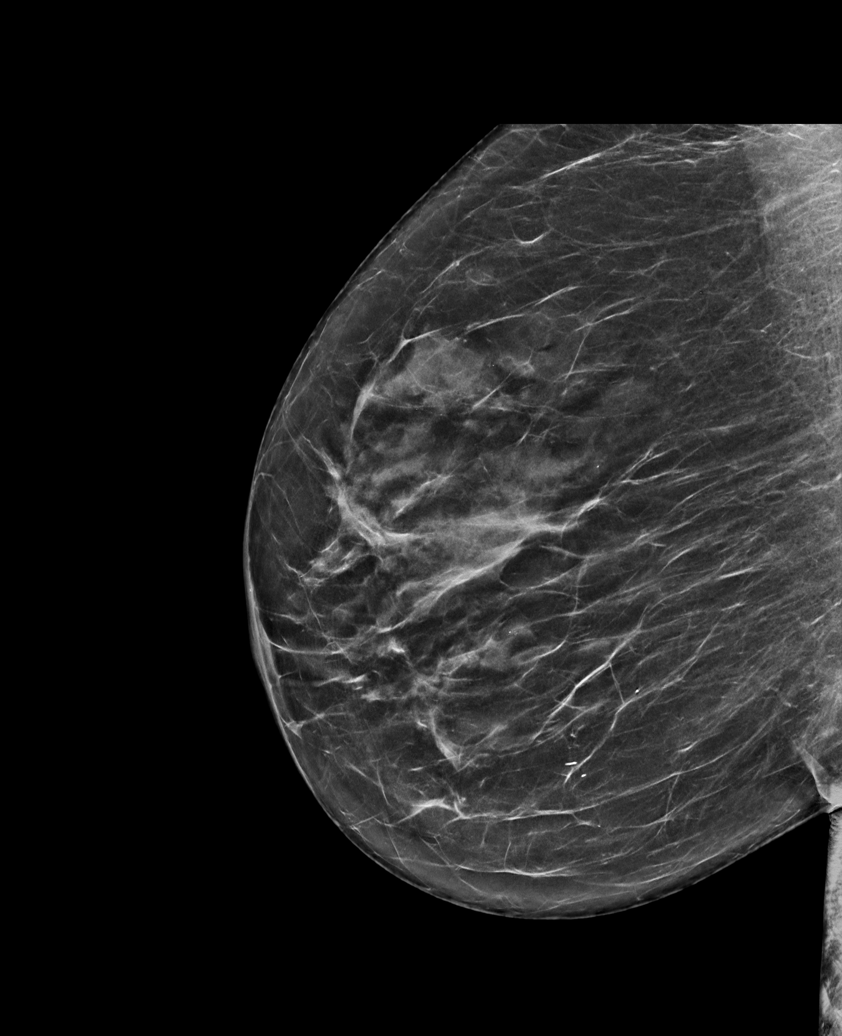

[R CC tomo · tomo slice 37/72.0]
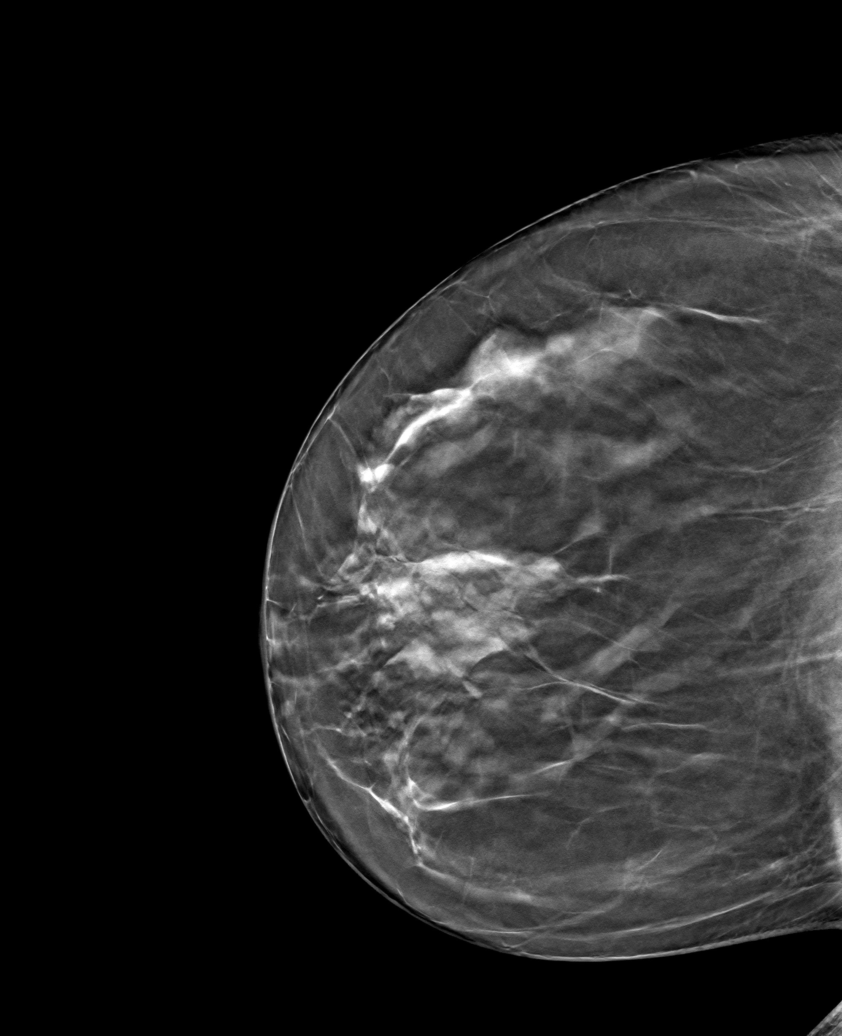

[R MLO tomo (1 of 2) · tomo slice 36/71.0]
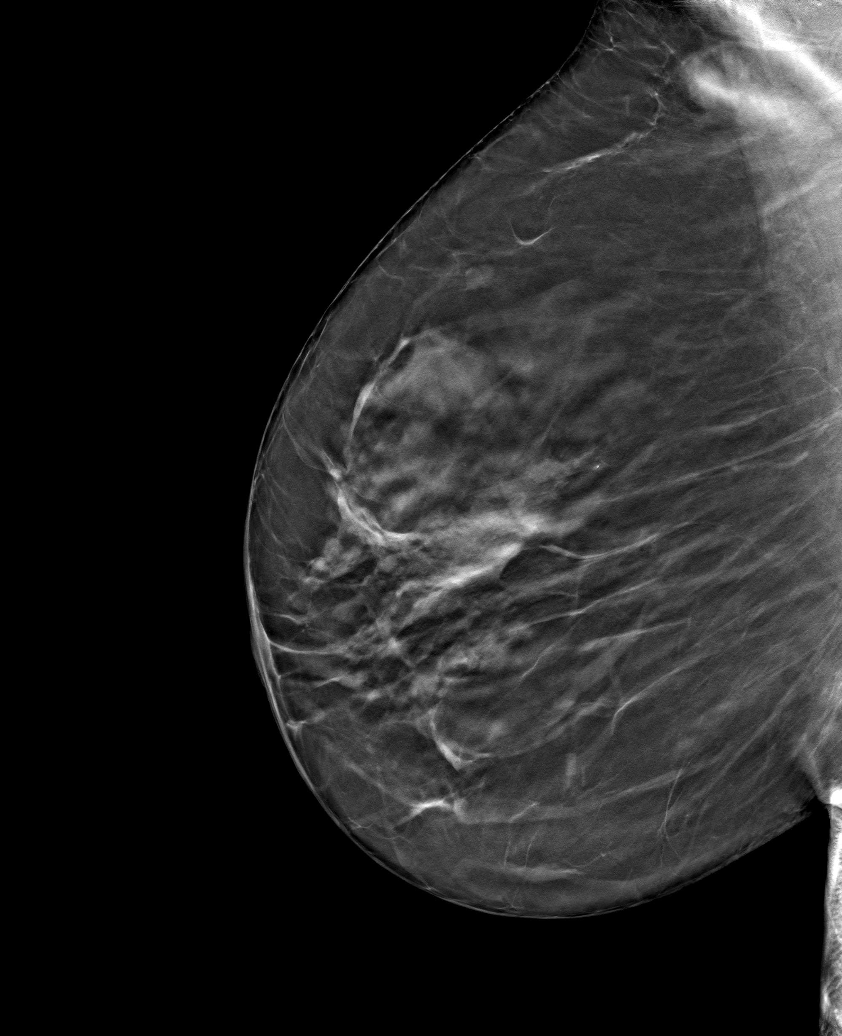

[R MLO tomo (2 of 2) · tomo slice 27/52.0]
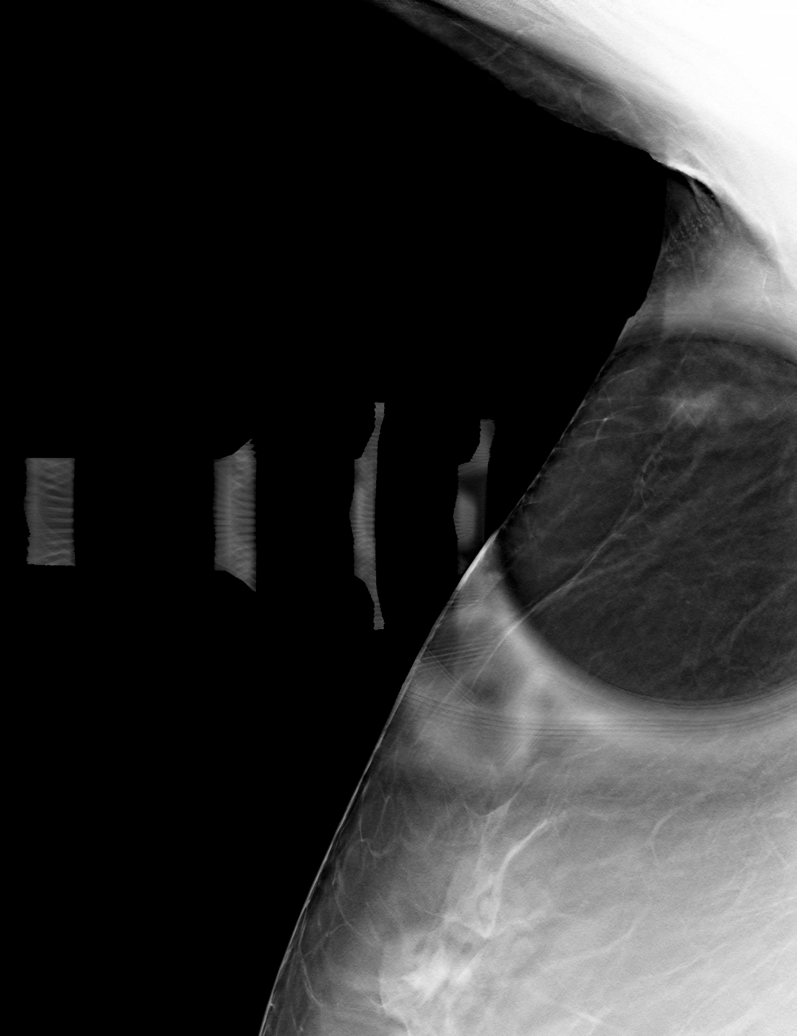

[6 of 18 positions shown; findings below may reference images not displayed]

ACR Breast Density Category b: There are scattered areas of
fibroglandular density.
FINDINGS: An asymmetry in the low lying right axilla is mammographically
stable. No new or suspicious findings in the remainder of the right
breast.
IMPRESSION: Stable, probably benign right breast asymmetry. Recommend continued
short-term follow-up.

RECOMMENDATION:
Bilateral diagnostic mammogram and possible right breast ultrasound
in 6 months.

I have discussed the findings and recommendations with the patient.
If applicable, a reminder letter will be sent to the patient
regarding the next appointment.

BI-RADS CATEGORY  3: Probably benign.

## 2022-11-05 ENCOUNTER — Telehealth: Payer: Self-pay

## 2022-11-05 ENCOUNTER — Other Ambulatory Visit (HOSPITAL_COMMUNITY): Payer: Self-pay

## 2022-11-05 NOTE — Telephone Encounter (Signed)
Patient Advocate Encounter  Received a fax from [facility] regarding Prior Authorization for L[place] 100UNIT/ML solution.   Authorization has been DENIED due to   Determination letter attached to patient chart

## 2022-11-05 NOTE — Telephone Encounter (Signed)
Patient Advocate Encounter   Received notification from [place] that prior authorization is required for NovoLOG 100UNIT/ML solution  Submitted: 11-05-2022 Key [id number]  Status is pending

## 2022-12-04 ENCOUNTER — Telehealth: Payer: Self-pay

## 2022-12-04 NOTE — Telephone Encounter (Signed)
Inbound fax requesting clinical notes sent to [phone]. Clinical notes routed via Epic.

## 2022-12-07 ENCOUNTER — Ambulatory Visit (INDEPENDENT_AMBULATORY_CARE_PROVIDER_SITE_OTHER): Payer: Medicare Other | Admitting: Internal Medicine

## 2022-12-07 ENCOUNTER — Encounter: Payer: Self-pay | Admitting: Internal Medicine

## 2022-12-07 VITALS — BP 120/70 | HR 92 | Ht 62.0 in | Wt 148.0 lb

## 2022-12-07 DIAGNOSIS — E1342 Other specified diabetes mellitus with diabetic polyneuropathy: Secondary | ICD-10-CM

## 2022-12-07 DIAGNOSIS — E139 Other specified diabetes mellitus without complications: Secondary | ICD-10-CM | POA: Diagnosis not present

## 2022-12-07 DIAGNOSIS — G63 Polyneuropathy in diseases classified elsewhere: Secondary | ICD-10-CM

## 2022-12-07 DIAGNOSIS — E785 Hyperlipidemia, unspecified: Secondary | ICD-10-CM

## 2022-12-07 DIAGNOSIS — E1042 Type 1 diabetes mellitus with diabetic polyneuropathy: Secondary | ICD-10-CM | POA: Insufficient documentation

## 2022-12-07 LAB — POCT GLYCOSYLATED HEMOGLOBIN (HGB A1C): Hemoglobin A1C: 7.2 % — AB (ref 4.0–5.6)

## 2022-12-07 NOTE — Progress Notes (Signed)
Name: Taylor [name]  Age/ Sex: 67 y.o., female   MRN/ DOB: [id number], 1955-08-22     PCP: Sharlene Dory, DO   Reason for Endocrinology Evaluation: Type 1 Diabetes Mellitus  Initial Endocrine Consultative Visit: 04/06/19    PATIENT IDENTIFIER: Taylor [name] is a 67 y.o. female with a past medical history of T1DM, SLE, GERD, Gastroparesis. The patient has followed with Endocrinology clinic since 04/06/2019 for consultative assistance with management of Taylor [name] diabetes.      DIABETIC HISTORY:  Taylor [name] was diagnosed with T1DM in 01/2019. Taylor [name] hemoglobin A1c was 11.1% on diagnosis. No DKA. Islet cell Ab's positive, consistent with L ADA. She was initially was put on an insulin mix but due to fluctuating BG's she was changed to an MDI regimen.  She was started on an insulin pump January 2021    Thyroid Nodule History :  She was found to have an incidental MNG on CT scan. A dominant right thyroid nodule of 1.6x0.9x1 cm was performed with benign cytology in 05/2015 Due to concerns of weight loss and feeling poorly a cortisol was checked by Taylor [name] endocrinologist in 05/2018 at 9.8 mcg/dL   Repeat Ultrasound 11/979 showed stability of the nodule form 2016 which confirmed a 6 yr stability and no further imaging was recommended   HORMONE REPLACEMENT THERAPY: She has been on Hormonal replacement therapy for > 5 yrs. She has been menopausal since ~ the age of 30 .  Follows with Gyn   SUBJECTIVE:   During the last visit (07/25/2022): A1c 6.8 %.  Today (03/22/2020): Taylor [name] is here for a follow up on diabetes management.  She checks Taylor [name] glucose multiple times a day through Dexcom.   She continues to follow-up with the pain clinic 11/23/2022 She continues to follow-up with neurology for migraine headaches She had a follow-up with Taylor [name] PCP for a physical 10/12/2022  Moving to [place] she became a grandmother for the first time 2023  Denies diarrhea but has chronic  constipation  Has chronic nausea  Raynaud's phenomenon is worse with buzzing and tingling sensation of the hands, worse on the left    HOME DIABETES REGIMEN:  Novolog  through [facility]     PUMP SETTINGS & DOWNLOAD      Pump   T-Slim   Settings   Insulin type   Novolog     Basal rate          0000 0.150    0100 0.136    0600 0.125    0800 0.136    1300 0.130    2200 0.150         I:C ratio          0000 1:11    0100 1:11    0600 1:11    0800 1:11    1300 1:10    2200 1:11         AIT   5 hrs         Sensitivity          0000  75           CURRENT PUMP STATISTICS: Unable to download     DIABETIC COMPLICATIONS: Microvascular complications:  Neuropathy Denies: CKD,  retinopathy  Last eye exam: Completed 11/01/2022   Macrovascular complications:    Denies: CAD, PVD, CVA  HISTORY:  Past Medical History:  Past Medical History:  Diagnosis Date   Anxiety    Colon polyp  Gastroparesis    GERD (gastroesophageal reflux disease)    Hyperlipidemia    Lupus    Migraine headache    Osteoarthritis    Premature atrial contractions    Raynaud's phenomenon    Symptomatic menopausal or female climacteric states    Past Surgical History:  Past Surgical History:  Procedure Laterality Date   CHOLECYSTECTOMY     HEMORRHOID SURGERY     HIP PINNING     LAPAROSCOPY     NASAL SINUS SURGERY     TOTAL KNEE ARTHROPLASTY     left knee   WRIST SURGERY     Left wrist   Social History:  reports that she has never smoked. She has never used smokeless tobacco. She reports that she does not drink alcohol and does not use drugs. Family History:  Family History  Problem Relation Age of Onset   CVA Mother    Alzheimer's disease Mother    Heart disease Father    Diabetes Father    Hypertension Father    Hyperlipidemia Father    CVA Brother    Cancer Maternal Aunt        Ovarian Cancer   Cancer Maternal Uncle        Colon Cancer     HOME MEDICATIONS: Allergies  as of 12/07/2022       Reactions   Lyrica [pregabalin]    Phenergan [promethazine]    Prochlorperazine         Medication List        Accurate as of December 07, 2022 12:36 PM. If you have any questions, ask your nurse or doctor.          aspirin 81 MG tablet Take 81 mg by mouth daily.   atenolol 50 MG tablet Commonly known as: TENORMIN TAKE 1 TABLET(50 MG) BY MOUTH DAILY   atorvastatin 20 MG tablet Commonly known as: Lipitor Take 1 tablet (20 mg total) by mouth daily.   Buprenorphine HCl 450 MCG Film Place 1 patch inside cheek 2 (two) times daily.   Belbuca 450 MCG Film Generic drug: Buprenorphine HCl Place inside cheek.   celecoxib 200 MG capsule Commonly known as: CELEBREX Take 200 mg by mouth daily.   cholecalciferol 25 MCG (1000 UNIT) tablet Commonly known as: VITAMIN D3 Take 1,000 Units by mouth daily.   Dexcom G6 Receiver Devi 1 each by Does not apply route See admin instructions. For continuous glucose monitoring; E13.9   Dexcom G6 Sensor Misc 1 each by Does not apply route See admin instructions. For use with continuous glucose monitoring system. Change sensor every 10 days; E13.9   Dexcom G6 Transmitter Misc 1 each by Does not apply route See admin instructions. For continuous glucose monitoring; E13.9   docusate sodium 100 MG capsule Commonly known as: COLACE Take 100 mg by mouth 2 (two) times daily.   estradiol 0.05 mg/24hr patch Commonly known as: Climara Place 1 patch (0.05 mg total) onto the skin once a week.   estradiol 0.05 MG/24HR patch Commonly known as: VIVELLE-DOT 1 patch once a week.   famotidine 40 MG tablet Commonly known as: PEPCID Take 40 mg by mouth daily.   FLUoxetine 40 MG capsule Commonly known as: PROZAC Take 40 mg by mouth daily.   Galcanezumab-gnlm 120 MG/ML Soaj Inject into the skin.   hyoscyamine 0.125 MG tablet Commonly known as: LEVSIN Take 1 tablet (0.125 mg total) by mouth every 4 (four) hours as  needed.   insulin aspart 100  UNIT/ML injection Commonly known as: NovoLOG 30 units a day via pump   Lancets Misc Use daily to check blood sugar  4 times daily Dx E11.9   lidocaine 2 % solution Commonly known as: XYLOCAINE Use as directed 10 mLs in the mouth or throat as needed for mouth pain.   lipase/protease/amylase 40981 UNITS Cpep capsule Commonly known as: Creon Take 2 capsules with meals and 1 capsule with a snack.   loratadine 10 MG tablet Commonly known as: CLARITIN Take 10 mg by mouth daily.   melatonin 1 MG Tabs tablet Take by mouth.   multivitamin with minerals Tabs tablet Take 1 tablet by mouth daily.   NovoFine 32G X 6 MM Misc Generic drug: Insulin Pen Needle Use with insulin pen   BD Pen Needle Nano 2nd Gen 32G X 4 MM Misc Generic drug: Insulin Pen Needle Use as need for insulin 5 times daily Ell.9   ONE TOUCH ULTRA 2 w/Device Kit Use once daily to check blood sugar..   DX E11.9   OneTouch Ultra test strip Generic drug: glucose blood Use once daily to check blood sugar five times daily.  DXE11.9   oxycodone 5 MG capsule Commonly known as: OXY-IR Take 5 mg by mouth every 4 (four) hours as needed.   progesterone 100 MG capsule Commonly known as: PROMETRIUM Take 1 capsule (100 mg total) by mouth as directed.   SUMAtriptan 20 MG/ACT nasal spray Commonly known as: IMITREX Place 1 spray into the nose every 2 (two) hours as needed for migraine.   T:slim Insulin Delivery System Devi by Does not apply route.         OBJECTIVE:   Vital Signs: BP 120/70 (BP Location: Left Arm, Patient Position: Sitting, Cuff Size: Large)   Pulse 92   Ht  (1.575 m)   Wt 148 lb (67.1 kg)   SpO2 97%   BMI 27.07 kg/m   Wt Readings from Last 3 Encounters:  12/07/22 148 lb (67.1 kg)  10/12/22 145 lb 4 oz (65.9 kg)  07/25/22 140 lb 12.8 oz (63.9 kg)     Exam: General: Pt appears well and is in NAD  Lungs: Clear with good BS bilat with no rales,  rhonchi, or wheezes  Heart: RRR with normal  Abdomen:  Soft, no guarding  Extremities: No pretibial edema.  Patient has been noted with purplish discoloration of the left foot and I was unable to palpate dorsalis pedis pulse  Neuro: MS is good with appropriate affect, pt is alert and Ox3    DM foot exam: 12/07/2022  The skin of the feet is intact without sores or ulcerations. The sensation is decreased to a screening 5.07, 10 gram monofilament bilaterally   ABI normal 07/2022     DATA REVIEWED:  Lab Results  Component Value Date   HGBA1C 7.2 (A) 12/07/2022   HGBA1C 6.8 (A) 07/25/2022   HGBA1C 6.6 (A) 12/05/2021    Latest Reference Range & Units 10/12/22 14:09  Sodium 135 - 146 mmol/L 138  Potassium 3.5 - 5.3 mmol/L 4.8  Chloride 98 - 110 mmol/L 98  CO2 20 - 32 mmol/L 34 (H)  Glucose 65 - 99 mg/dL 191 (H)  BUN 7 - 25 mg/dL 18  Creatinine 4.78 - 2.95 mg/dL 6.21  Calcium 8.6 - 30.8 mg/dL 9.2  BUN/Creatinine Ratio 6 - 22 (calc) SEE NOTE:  AG Ratio 1.0 - 2.5 (calc) 1.3  AST 10 - 35 U/L 24  ALT 6 - 29  U/L 14  Total Protein 6.1 - 8.1 g/dL 7.2  Total Bilirubin 0.2 - 1.2 mg/dL 0.5  Total CHOL/HDL Ratio <5.0 (calc) 2.7  Cholesterol <200 mg/dL 865 (H)  HDL Cholesterol > OR = 50 mg/dL 77  LDL Cholesterol (Calc) mg/dL (calc) 784 (H)  Non-HDL Cholesterol (Calc) <130 mg/dL (calc) 696 (H)  Triglycerides <150 mg/dL 295  (H): Data is abnormally high   Results for FLORENTINA, MARQUART (MRN [id number]) as of 05/22/2019 14:05  Ref. Range 04/06/2019 14:43 04/06/2019 15:02  Glutamic Acid Decarb Ab Latest Ref Range: <5 IU/mL >250 (H)   Islet Cell Ab Titer Latest Units: JDF units  1,280 (H)    ASSESSMENT / PLAN / RECOMMENDATIONS:    1) Latent Autoimmune Diabetes Mellitus, Sub-Optimally Controlled with neuropathic complications- Most recent A1c of 7.2 %. Goal A1c < 7.0%   -Taylor [name] A1c has slightly increased -Unfortunately, we have not been able to download Taylor [name] pump today as the USB port appears  to be distorted, the patient did endorse issues with charging Taylor [name] pump recently, she was advised to contact the [facility] helpline as she most likely will need a new one -She is interested in upgrading to [facility] G7, which should be done through [facility] -We discussed the importance of resuming exercise as well as monitoring and following a low-carb and low-fat diet.  The fact that increasing Taylor [name] A1c can started with also increasing Taylor [name] LDL, makes me suspect lifestyle changes  MEDICATIONS: Novolog   T-Slim Pump setting   Pump   T-Slim   Settings   Insulin type   Novolog    Basal rate       0000 0.150   0100 0.136   0600 0.125   0800 0.136   1300 0.130   2200 0.150      I:C ratio       0000 1:11   0100 1:11   0600 1:11   0800 1:11   1300 1:10   2200 1:11      AIT  5 hrs      Sensitivity       0000  75         EDUCATION / INSTRUCTIONS: BG monitoring instructions: Patient is instructed to check Taylor [name] blood sugars 4 times a day Call [facility] if: BG persistently < 70  I reviewed the Rule of 15 for the treatment of hypoglycemia in detail with the patient. Literature supplied.     2) Dyslipidemia   -LDL has increased from 83 mg/DL to 102 mg/DL -Patient assures me compliance with atorvastatin -She does indicate that she has not exercised recently -Patient encouraged to resume exercise, she was also encouraged to follow a low-fat diet -If this remains elevated, will need to increase atorvastatin  Medication Continue atorvastatin 20 mg daily   Peripheral Neuropathy:  -She is allergic to Lyrica, gabapentin makes Taylor [name] drowsy - She follows with pain clinic    F/U in 6 months    Signed electronically by: Lyndle Herrlich, MD  [facility]  [facility] [address] [place], [place] [address] Phone: [phone] FAX: [phone]   CC: Sharlene Dory, DO [address] [place] [address] Phone: [phone]  Fax: [phone]  Return to Endocrinology clinic as below: No future appointments.

## 2022-12-07 NOTE — Patient Instructions (Signed)

## 2023-03-11 ENCOUNTER — Other Ambulatory Visit: Payer: Self-pay

## 2023-03-11 MED ORDER — INSULIN ASPART 100 UNIT/ML IJ SOLN: INTRAMUSCULAR | 6 refills | Status: DC

## 2023-03-12 ENCOUNTER — Telehealth: Payer: Self-pay

## 2023-03-12 ENCOUNTER — Other Ambulatory Visit (HOSPITAL_COMMUNITY): Payer: Self-pay

## 2023-03-12 NOTE — Telephone Encounter (Signed)
I called over to [facility] and they need her last OV and her serial number from her pump.   Fax number: [phone]

## 2023-03-12 NOTE — Telephone Encounter (Signed)
Patient stating that a PA needs to be completed for the  Novolog. Patient states that since it going through Part B it needs to include office note, Pump serial number and when she started pump.  [id number] serial number and start date was 09/16/19.

## 2023-03-13 NOTE — Telephone Encounter (Signed)
Forms have been faxed to [facility] and patient has been notified.

## 2023-03-14 ENCOUNTER — Other Ambulatory Visit: Payer: Self-pay

## 2023-03-14 MED ORDER — INSULIN ASPART 100 UNIT/ML IJ SOLN: INTRAMUSCULAR | 6 refills | Status: AC

## 2023-06-11 ENCOUNTER — Ambulatory Visit: Payer: Medicare Other | Admitting: Internal Medicine
